# Patient Record
Sex: Male | Born: 1980 | Race: Black or African American | Hispanic: No | Marital: Single | Smoking: Never smoker
Health system: Southern US, Community
[De-identification: ages and names within clinical notes are randomized; demographics above are authoritative.]

## PROBLEM LIST (undated history)

## (undated) DIAGNOSIS — Z889 Allergy status to unspecified drugs, medicaments and biological substances status: Secondary | ICD-10-CM

## (undated) DIAGNOSIS — M109 Gout, unspecified: Secondary | ICD-10-CM

## (undated) DIAGNOSIS — I10 Essential (primary) hypertension: Secondary | ICD-10-CM

## (undated) DIAGNOSIS — M2550 Pain in unspecified joint: Secondary | ICD-10-CM

## (undated) DIAGNOSIS — L0591 Pilonidal cyst without abscess: Secondary | ICD-10-CM

## (undated) DIAGNOSIS — K219 Gastro-esophageal reflux disease without esophagitis: Secondary | ICD-10-CM

## (undated) DIAGNOSIS — E119 Type 2 diabetes mellitus without complications: Secondary | ICD-10-CM

## (undated) DIAGNOSIS — E785 Hyperlipidemia, unspecified: Secondary | ICD-10-CM

## (undated) DIAGNOSIS — R202 Paresthesia of skin: Secondary | ICD-10-CM

## (undated) HISTORY — DX: Pain in unspecified joint: M25.50

## (undated) HISTORY — DX: Pilonidal cyst without abscess: L05.91

## (undated) HISTORY — DX: Type 2 diabetes mellitus without complications: E11.9

## (undated) HISTORY — DX: Morbid (severe) obesity due to excess calories: E66.01

## (undated) HISTORY — DX: Hyperlipidemia, unspecified: E78.5

## (undated) HISTORY — DX: Paresthesia of skin: R20.2

## (undated) HISTORY — DX: Gout, unspecified: M10.9

---

## 2009-09-28 ENCOUNTER — Emergency Department (HOSPITAL_COMMUNITY): Admission: EM | Admit: 2009-09-28 | Discharge: 2009-09-28 | Payer: Self-pay | Admitting: Family Medicine

## 2009-11-07 ENCOUNTER — Ambulatory Visit (HOSPITAL_COMMUNITY): Admission: RE | Admit: 2009-11-07 | Discharge: 2009-11-07 | Payer: Self-pay | Admitting: Surgery

## 2010-09-10 HISTORY — PX: THROAT SURGERY: SHX803

## 2010-11-27 LAB — POCT I-STAT, CHEM 8
BUN: 14 mg/dL (ref 6–23)
Glucose, Bld: 98 mg/dL (ref 70–99)
Potassium: 3.8 mEq/L (ref 3.5–5.1)
TCO2: 30 mmol/L (ref 0–100)

## 2010-11-27 LAB — POCT RAPID STREP A (OFFICE): Streptococcus, Group A Screen (Direct): POSITIVE — AB

## 2010-11-30 LAB — BASIC METABOLIC PANEL
BUN: 9 mg/dL (ref 6–23)
Calcium: 9.5 mg/dL (ref 8.4–10.5)
Potassium: 4.4 mEq/L (ref 3.5–5.1)
Sodium: 141 mEq/L (ref 135–145)

## 2010-11-30 LAB — CBC
MCHC: 33.3 g/dL (ref 30.0–36.0)
MCV: 80.1 fL (ref 78.0–100.0)
RBC: 5.41 MIL/uL (ref 4.22–5.81)

## 2013-12-17 ENCOUNTER — Encounter (HOSPITAL_COMMUNITY): Payer: Self-pay | Admitting: Emergency Medicine

## 2013-12-17 ENCOUNTER — Emergency Department (HOSPITAL_COMMUNITY)
Admission: EM | Admit: 2013-12-17 | Discharge: 2013-12-18 | Disposition: A | Discharge status: Home / Self Care | Payer: BC Managed Care – PPO | Attending: Emergency Medicine | Admitting: Emergency Medicine

## 2013-12-17 ENCOUNTER — Emergency Department (HOSPITAL_COMMUNITY): Payer: BC Managed Care – PPO

## 2013-12-17 DIAGNOSIS — Z79899 Other long term (current) drug therapy: Secondary | ICD-10-CM | POA: Insufficient documentation

## 2013-12-17 DIAGNOSIS — F411 Generalized anxiety disorder: Secondary | ICD-10-CM | POA: Insufficient documentation

## 2013-12-17 DIAGNOSIS — E663 Overweight: Secondary | ICD-10-CM | POA: Insufficient documentation

## 2013-12-17 DIAGNOSIS — R0789 Other chest pain: Secondary | ICD-10-CM

## 2013-12-17 DIAGNOSIS — I10 Essential (primary) hypertension: Secondary | ICD-10-CM | POA: Insufficient documentation

## 2013-12-17 HISTORY — DX: Essential (primary) hypertension: I10

## 2013-12-17 LAB — I-STAT TROPONIN, ED: TROPONIN I, POC: 0.01 ng/mL (ref 0.00–0.08)

## 2013-12-17 LAB — CBC
HEMATOCRIT: 45.6 % (ref 39.0–52.0)
HEMOGLOBIN: 15.9 g/dL (ref 13.0–17.0)
MCH: 27.3 pg (ref 26.0–34.0)
MCHC: 34.9 g/dL (ref 30.0–36.0)
MCV: 78.4 fL (ref 78.0–100.0)
Platelets: 263 10*3/uL (ref 150–400)
RBC: 5.82 MIL/uL — AB (ref 4.22–5.81)
RDW: 13.6 % (ref 11.5–15.5)
WBC: 8.2 10*3/uL (ref 4.0–10.5)

## 2013-12-17 LAB — D-DIMER, QUANTITATIVE: D-Dimer, Quant: 0.36 ug/mL-FEU (ref 0.00–0.48)

## 2013-12-17 LAB — BASIC METABOLIC PANEL
BUN: 11 mg/dL (ref 6–23)
CALCIUM: 9.7 mg/dL (ref 8.4–10.5)
CHLORIDE: 103 meq/L (ref 96–112)
CO2: 27 meq/L (ref 19–32)
Creatinine, Ser: 1.25 mg/dL (ref 0.50–1.35)
GFR calc Af Amer: 87 mL/min — ABNORMAL LOW (ref >90)
GFR, EST NON AFRICAN AMERICAN: 75 mL/min — AB (ref >90)
Glucose, Bld: 102 mg/dL — ABNORMAL HIGH (ref 70–99)
POTASSIUM: 4.3 meq/L (ref 3.7–5.3)
Sodium: 143 mEq/L (ref 137–147)

## 2013-12-17 LAB — TROPONIN I: Troponin I: <0.3 ng/mL (ref <0.30)

## 2013-12-17 NOTE — Discharge Instructions (Signed)
Chest Pain (Nonspecific) °It is often hard to give a specific diagnosis for the cause of chest pain. There is always a chance that your pain could be related to something serious, such as a heart attack or a blood clot in the lungs. You need to follow up with your caregiver for further evaluation. °CAUSES  °· Heartburn. °· Pneumonia or bronchitis. °· Anxiety or stress. °· Inflammation around your heart (pericarditis) or lung (pleuritis or pleurisy). °· A blood clot in the lung. °· A collapsed lung (pneumothorax). It can develop suddenly on its own (spontaneous pneumothorax) or from injury (trauma) to the chest. °· Shingles infection (herpes zoster virus). °The chest wall is composed of bones, muscles, and cartilage. Any of these can be the source of the pain. °· The bones can be bruised by injury. °· The muscles or cartilage can be strained by coughing or overwork. °· The cartilage can be affected by inflammation and become sore (costochondritis). °DIAGNOSIS  °Lab tests or other studies, such as X-rays, electrocardiography, stress testing, or cardiac imaging, may be needed to find the cause of your pain.  °TREATMENT  °· Treatment depends on what may be causing your chest pain. Treatment may include: °· Acid blockers for heartburn. °· Anti-inflammatory medicine. °· Pain medicine for inflammatory conditions. °· Antibiotics if an infection is present. °· You may be advised to change lifestyle habits. This includes stopping smoking and avoiding alcohol, caffeine, and chocolate. °· You may be advised to keep your head raised (elevated) when sleeping. This reduces the chance of acid going backward from your stomach into your esophagus. °· Most of the time, nonspecific chest pain will improve within 2 to 3 days with rest and mild pain medicine. °HOME CARE INSTRUCTIONS  °· If antibiotics were prescribed, take your antibiotics as directed. Finish them even if you start to feel better. °· For the next few days, avoid physical  activities that bring on chest pain. Continue physical activities as directed. °· Do not smoke. °· Avoid drinking alcohol. °· Only take over-the-counter or prescription medicine for pain, discomfort, or fever as directed by your caregiver. °· Follow your caregiver's suggestions for further testing if your chest pain does not go away. °· Keep any follow-up appointments you made. If you do not go to an appointment, you could develop lasting (chronic) problems with pain. If there is any problem keeping an appointment, you must call to reschedule. °SEEK MEDICAL CARE IF:  °· You think you are having problems from the medicine you are taking. Read your medicine instructions carefully. °· Your chest pain does not go away, even after treatment. °· You develop a rash with blisters on your chest. °SEEK IMMEDIATE MEDICAL CARE IF:  °· You have increased chest pain or pain that spreads to your arm, neck, jaw, back, or abdomen. °· You develop shortness of breath, an increasing cough, or you are coughing up blood. °· You have severe back or abdominal pain, feel nauseous, or vomit. °· You develop severe weakness, fainting, or chills. °· You have a fever. °THIS IS AN EMERGENCY. Do not wait to see if the pain will go away. Get medical help at once. Call your local emergency services (911 in [place] Do not drive yourself to the hospital. °MAKE SURE YOU:  °· Understand these instructions. °· Will watch your condition. °· Will get help right away if you are not doing well or get worse. °Document Released: 06/06/2005 Document Revised: 11/19/2011 Document Reviewed: 04/01/2008 °ExitCare® Patient Information ©2014 [employer]   [employer]. ° °

## 2013-12-17 NOTE — ED Provider Notes (Signed)
CSN: [id number]     Arrival date & time 12/17/13  1802 History   First MD Initiated Contact with Patient 12/17/13 2059     Chief Complaint  Patient presents with  . Chest Pain     (Consider location/radiation/quality/duration/timing/severity/associated sxs/prior Treatment) Patient is a 33 y.o. male presenting with chest pain. The history is provided by the patient. No language interpreter was used.  Chest Pain Pain location:  L lateral chest Pain quality: burning   Pain radiates to:  Does not radiate Pain radiates to the back: no   Pain severity:  No pain Onset quality:  Gradual Duration:  7 days Timing:  Sporadic Progression:  Resolved Chronicity:  New Context: stress   Associated symptoms: anxiety   Associated symptoms: no nausea, no palpitations and no shortness of breath   Risk factors: hypertension, male sex and obesity   Patient endorses being anxious and having left sided chest discomfort intermittently for the last week after the death of a family friend.  No family history of early heart disease.  Patient is overweight with currently treated and normally well controlled hypertension.  Patient is pain-free at present.  Past Medical History  Diagnosis Date  . Hypertension    History reviewed. No pertinent past surgical history. History reviewed. No pertinent family history. History  Substance Use Topics  . Smoking status: Never Smoker   . Smokeless tobacco: Not on file  . Alcohol Use: No    Review of Systems  Respiratory: Negative for shortness of breath.   Cardiovascular: Positive for chest pain. Negative for palpitations.  Gastrointestinal: Negative for nausea.  All other systems reviewed and are negative.     Allergies  Review of patient's allergies indicates no known allergies.  Home Medications   Current Outpatient Rx  Name  Route  Sig  Dispense  Refill  . amLODipine (NORVASC) 5 MG tablet   Oral   Take 5 mg by mouth daily.         . Febuxostat  (ULORIC) 80 MG TABS   Oral   Take 1 tablet by mouth daily.         . metoprolol tartrate (LOPRESSOR) 25 MG tablet   Oral   Take 25 mg by mouth daily.         . Multiple Vitamin (MULTIVITAMIN WITH MINERALS) TABS tablet   Oral   Take 1 tablet by mouth daily.          BP 128/98  Pulse 58  Temp(Src) 98.1 F (36.7 C) (Oral)  Resp 18  Ht 6' (1.829 m)  Wt 464 lb 6.4 oz (210.65 kg)  BMI 62.97 kg/m2  SpO2 97% Physical Exam  Nursing note and vitals reviewed. Constitutional: He is oriented to person, place, and time. He appears well-developed and well-nourished. No distress.  HENT:  Head: Normocephalic.  Eyes: Conjunctivae are normal. Pupils are equal, round, and reactive to light.  Neck: Normal range of motion. No JVD present.  Cardiovascular: Normal rate and regular rhythm.   Pulmonary/Chest: Effort normal and breath sounds normal.  Abdominal: Soft. Bowel sounds are normal.  Musculoskeletal: Normal range of motion. He exhibits no edema and no tenderness.  Lymphadenopathy:    He has no cervical adenopathy.  Neurological: He is alert and oriented to person, place, and time.  Skin: Skin is warm and dry.  Psychiatric: He has a normal mood and affect. His behavior is normal. Judgment and thought content normal.    ED Course  Procedures (including critical  care time) Labs Review Labs Reviewed  CBC - Abnormal; Notable for the following:    RBC 5.82 (*)    All other components within normal limits  BASIC METABOLIC PANEL - Abnormal; Notable for the following:    Glucose, Bld 102 (*)    GFR calc non Af Amer 75 (*)    GFR calc Af Amer 87 (*)    All other components within normal limits  I-STAT TROPOININ, ED   Imaging Review Dg Chest 2 View  12/17/2013   CLINICAL DATA:  Left-sided chest pain  EXAM: CHEST  2 VIEW  COMPARISON:  DG CHEST 2 VIEW dated 11/03/2009  FINDINGS: The heart size and mediastinal contours are within normal limits. Both lungs are clear. The visualized  skeletal structures are unremarkable.  IMPRESSION: No active cardiopulmonary disease.   Electronically Signed   By: Elige Ko   On: 12/17/2013 18:55     EKG Interpretation None     Lab and radiology results reviewed and shared with patient.  No indication of ischemia on ECG, normal troponin. MDM   Final diagnoses:  None    Atypical chest pain.  Follow-up with PCP. Return precautions discussed.    Jimmye Norman, NP 12/18/13 [phone]

## 2013-12-17 NOTE — ED Notes (Signed)
Pt c/o L sided "surface area chest irritation" for past week. He states he thinks part of it is due to recent death in family that he has been worried about but he just wants to be checked out. He is A&Ox4, resp e/u

## 2013-12-18 NOTE — ED Notes (Signed)
Pt A&OX4, ambulatory at discharge, verbalizing no complaints at this time. 

## 2013-12-18 NOTE — ED Provider Notes (Signed)
Medical screening examination/treatment/procedure(s) were conducted as a shared visit with non-physician practitioner(s) and myself.  I personally evaluated the patient during the encounter.  Morbidly obese male with 1 week history of L sided "soreness" in chest that comes and goes, lasting several hours at a time, goes away when "he doesn't think about it."  No cardiac history. No SOB, nausea, diaphoresis.  Stress with recent death in family. Atypical for ACS or PE. EKG nsr, no changes. Troponin negative x 2, d-dimer negative.   EKG Interpretation   Date/Time:  Thursday December 17 2013 18:07:42 EDT Ventricular Rate:  86 PR Interval:  188 QRS Duration: 98 QT Interval:  364 QTC Calculation: 435 R Axis:   9 Text Interpretation:  Normal sinus rhythm Normal ECG No significant change  was found Confirmed by Manus GunningANCOUR  MD, Kynsli Haapala [phone]([id number]) on 12/17/2013 9:32:39 PM       Glynn OctaveStephen Akshaj Besancon, MD 12/18/13 1029

## 2014-06-25 ENCOUNTER — Ambulatory Visit
Admission: RE | Admit: 2014-06-25 | Discharge: 2014-06-25 | Disposition: A | Discharge status: Home / Self Care | Payer: BC Managed Care – PPO | Source: Ambulatory Visit | Attending: Family Medicine | Admitting: Family Medicine

## 2014-06-25 ENCOUNTER — Other Ambulatory Visit: Payer: Self-pay | Admitting: Family Medicine

## 2014-06-25 DIAGNOSIS — M25532 Pain in left wrist: Secondary | ICD-10-CM

## 2014-08-09 ENCOUNTER — Encounter: Payer: Self-pay | Admitting: *Deleted

## 2014-08-10 ENCOUNTER — Encounter: Payer: BC Managed Care – PPO | Admitting: Neurology

## 2014-08-10 ENCOUNTER — Other Ambulatory Visit: Payer: Self-pay | Admitting: *Deleted

## 2014-08-10 DIAGNOSIS — R202 Paresthesia of skin: Secondary | ICD-10-CM

## 2014-08-12 ENCOUNTER — Encounter: Payer: Self-pay | Admitting: *Deleted

## 2014-08-12 ENCOUNTER — Telehealth: Payer: Self-pay | Admitting: Neurology

## 2014-08-12 NOTE — Telephone Encounter (Signed)
Pt no showed 08/10/14 EMG appt w/ Dr. Allena KatzPatel.  Alcario DroughtErica - please send a no show letter to patient and contact Dr. Merita NortonSwayne's office / Oneita KrasSherri S.

## 2014-08-12 NOTE — Progress Notes (Unsigned)
No show letter sent for 08/10/2014 

## 2014-12-01 NOTE — Progress Notes (Signed)
Please put orders in Epic surgery 12-10-14 pre op 12-06-14 Thanks

## 2014-12-02 ENCOUNTER — Other Ambulatory Visit (HOSPITAL_COMMUNITY): Payer: Self-pay | Admitting: *Deleted

## 2014-12-02 NOTE — Patient Instructions (Addendum)
Kara PacerChristopher Bernath  12/02/2014   Your procedure is scheduled on: Friday April 1st, 2016  [facility]  Entr[facility] and follow signs to               Short Stay Center at 745 AM.  Call this number if you have problems the morning of surgery [phone]   Remember:  Do not eat food or drink liquids :After Midnight. Thursday NIGHT     Take these medicines the morning of surgery with A SIP OF WATER: NONE                              You may not have any metal on your body including hair pins and              piercings  Do not wear jewelry, make-up, lotions, powders or perfumes.             Do not wear nail polish.  Do not shave  48 hours prior to surgery.              Men may shave face and neck.   Do not bring valuables to the hospital. [facility] IS NOT             RESPONSIBLE   FOR VALUABLES.  Contacts, dentures or bridgework may not be worn into surgery.  Leave suitcase in the car. After surgery it may be brought to your room.     Patients discharged the day of surgery will not be allowed to drive home.  Name and phone number of your driver:WIFE  Special Instructions: N/A              Please read over the following fact sheets you were given: _____________________________________________________________________             [facility] - Preparing for [facility] Before surgery, you can play an important role.  Because skin is not sterile, your skin needs to be as free of germs as possible.  You can reduce the number of germs on your skin by washing with CHG (chlorahexidine gluconate) soap before surgery.  CHG is an antiseptic cleaner which kills germs and bonds with the skin to continue killing germs even after washing. Please DO NOT use if you have an allergy to CHG or antibacterial soaps.  If your skin becomes reddened/irritated stop using the CHG and inform your nurse when you arrive at Short Stay. Do not shave (including legs and underarms) for at  least 48 hours prior to the first CHG shower.  You may shave your face/neck. Please follow these instructions carefully:  1.  Shower with CHG Soap the night before surgery and the  morning of Surgery.  2.  If you choose to wash your hair, wash your hair first as usual with your  normal  shampoo.  3.  After you shampoo, rinse your hair and body thoroughly to remove the  shampoo.                           4.  Use CHG as you would any other liquid soap.  You can apply chg directly  to the skin and wash  Gently with a scrungie or clean washcloth.  5.  Apply the CHG Soap to your body ONLY FROM THE NECK DOWN.   Do not use on face/ open                           Wound or open sores. Avoid contact with eyes, ears mouth and genitals (private parts).                       Wash face,  Genitals (private parts) with your normal soap.             6.  Wash thoroughly, paying special attention to the area where your surgery  will be performed.  7.  Thoroughly rinse your body with warm water from the neck down.  8.  DO NOT shower/wash with your normal soap after using and rinsing off  the CHG Soap.                9.  Pat yourself dry with a clean towel.            10.  Wear clean pajamas.            11.  Place clean sheets on your bed the night of your first shower and do not  sleep with pets. Day of Surgery : Do not apply any lotions/deodorants the morning of surgery.  Please wear clean clothes to the hospital/surgery center.  FAILURE TO FOLLOW THESE INSTRUCTIONS MAY RESULT IN THE CANCELLATION OF YOUR SURGERY PATIENT SIGNATURE_________________________________  NURSE SIGNATURE__________________________________  ________________________________________________________________________

## 2014-12-02 NOTE — Progress Notes (Signed)
ekg 12-17-13 epic Chest xray 12-17-13 epic

## 2014-12-06 ENCOUNTER — Encounter (HOSPITAL_COMMUNITY)
Admission: RE | Admit: 2014-12-06 | Discharge: 2014-12-06 | Disposition: A | Discharge status: Home / Self Care | Payer: BLUE CROSS/BLUE SHIELD | Source: Ambulatory Visit | Attending: Surgery | Admitting: Surgery

## 2014-12-06 ENCOUNTER — Encounter (HOSPITAL_COMMUNITY): Payer: Self-pay

## 2014-12-06 DIAGNOSIS — L0591 Pilonidal cyst without abscess: Secondary | ICD-10-CM | POA: Insufficient documentation

## 2014-12-06 DIAGNOSIS — Z01812 Encounter for preprocedural laboratory examination: Secondary | ICD-10-CM | POA: Diagnosis not present

## 2014-12-06 HISTORY — DX: Allergy status to unspecified drugs, medicaments and biological substances: Z88.9

## 2014-12-06 HISTORY — DX: Gastro-esophageal reflux disease without esophagitis: K21.9

## 2014-12-06 LAB — BASIC METABOLIC PANEL WITH GFR
Anion gap: 9 (ref 5–15)
BUN: 19 mg/dL (ref 6–23)
CO2: 29 mmol/L (ref 19–32)
Calcium: 9.1 mg/dL (ref 8.4–10.5)
Chloride: 104 mmol/L (ref 96–112)
Creatinine, Ser: 1.35 mg/dL (ref 0.50–1.35)
GFR calc Af Amer: 79 mL/min — ABNORMAL LOW
GFR calc non Af Amer: 68 mL/min — ABNORMAL LOW
Glucose, Bld: 98 mg/dL (ref 70–99)
Potassium: 4.3 mmol/L (ref 3.5–5.1)
Sodium: 142 mmol/L (ref 135–145)

## 2014-12-06 LAB — CBC
HEMATOCRIT: 45.3 % (ref 39.0–52.0)
Hemoglobin: 14.4 g/dL (ref 13.0–17.0)
MCH: 25.5 pg — AB (ref 26.0–34.0)
MCHC: 31.8 g/dL (ref 30.0–36.0)
MCV: 80.3 fL (ref 78.0–100.0)
Platelets: 289 10*3/uL (ref 150–400)
RBC: 5.64 MIL/uL (ref 4.22–5.81)
RDW: 13.7 % (ref 11.5–15.5)
WBC: 10 10*3/uL (ref 4.0–10.5)

## 2014-12-06 NOTE — Progress Notes (Signed)
Dr Marvis RepressMartin--Need pre op orders please-  Surgery 12/10/14

## 2014-12-06 NOTE — Progress Notes (Signed)
   12/06/14 1056  OBSTRUCTIVE SLEEP APNEA  Have you ever been diagnosed with sleep apnea through a sleep study? No  Do you snore loudly (loud enough to be heard through closed doors)?  1  Do you often feel tired, fatigued, or sleepy during the daytime? 0  Has anyone observed you stop breathing during your sleep? 0  Do you have, or are you being treated for high blood pressure? 1  BMI more than 35 kg/m2? 1  Age over 33 years old? 0  Neck circumference greater than 40 cm/16 inches? 1  Gender: 1

## 2014-12-07 ENCOUNTER — Ambulatory Visit: Payer: Self-pay | Admitting: Surgery

## 2014-12-10 ENCOUNTER — Ambulatory Visit (HOSPITAL_COMMUNITY): Payer: BLUE CROSS/BLUE SHIELD | Admitting: Certified Registered Nurse Anesthetist

## 2014-12-10 ENCOUNTER — Encounter (HOSPITAL_COMMUNITY)
Admission: RE | Disposition: A | Discharge status: Home / Self Care | Payer: Self-pay | Source: Ambulatory Visit | Attending: Surgery

## 2014-12-10 ENCOUNTER — Ambulatory Visit (HOSPITAL_COMMUNITY)
Admission: RE | Admit: 2014-12-10 | Discharge: 2014-12-10 | Disposition: A | Discharge status: Home / Self Care | Payer: BLUE CROSS/BLUE SHIELD | Source: Ambulatory Visit | Attending: Surgery | Admitting: Surgery

## 2014-12-10 ENCOUNTER — Encounter (HOSPITAL_COMMUNITY): Payer: Self-pay | Admitting: *Deleted

## 2014-12-10 DIAGNOSIS — E785 Hyperlipidemia, unspecified: Secondary | ICD-10-CM | POA: Diagnosis not present

## 2014-12-10 DIAGNOSIS — K219 Gastro-esophageal reflux disease without esophagitis: Secondary | ICD-10-CM | POA: Diagnosis not present

## 2014-12-10 DIAGNOSIS — M109 Gout, unspecified: Secondary | ICD-10-CM | POA: Diagnosis not present

## 2014-12-10 DIAGNOSIS — Z6841 Body Mass Index (BMI) 40.0 and over, adult: Secondary | ICD-10-CM | POA: Insufficient documentation

## 2014-12-10 DIAGNOSIS — I10 Essential (primary) hypertension: Secondary | ICD-10-CM | POA: Insufficient documentation

## 2014-12-10 DIAGNOSIS — L0501 Pilonidal cyst with abscess: Secondary | ICD-10-CM | POA: Insufficient documentation

## 2014-12-10 HISTORY — PX: PILONIDAL CYST EXCISION: SHX744

## 2014-12-10 LAB — GLUCOSE, CAPILLARY: GLUCOSE-CAPILLARY: 119 mg/dL — AB (ref 70–99)

## 2014-12-10 SURGERY — EXCISION, PILONIDAL CYST, EXTENSIVE
Anesthesia: General | Site: Coccyx

## 2014-12-10 MED ORDER — PROPOFOL 10 MG/ML IV BOLUS
INTRAVENOUS | Status: AC
  Filled 2014-12-10: qty 20

## 2014-12-10 MED ORDER — PROMETHAZINE HCL 25 MG/ML IJ SOLN
6.2500 mg | INTRAMUSCULAR | Status: DC | PRN
Start: 2014-12-10 — End: 2014-12-10

## 2014-12-10 MED ORDER — LIDOCAINE HCL (CARDIAC) 20 MG/ML IV SOLN
INTRAVENOUS | Status: DC | PRN
  Administered 2014-12-10: 100 mg via INTRAVENOUS

## 2014-12-10 MED ORDER — ONDANSETRON HCL 4 MG/2ML IJ SOLN
INTRAMUSCULAR | Status: DC | PRN
  Administered 2014-12-10: 4 mg via INTRAVENOUS

## 2014-12-10 MED ORDER — MIDAZOLAM HCL 2 MG/2ML IJ SOLN
INTRAMUSCULAR | Status: DC | PRN
  Administered 2014-12-10: 2 mg via INTRAVENOUS

## 2014-12-10 MED ORDER — FENTANYL CITRATE 0.05 MG/ML IJ SOLN
INTRAMUSCULAR | Status: DC | PRN
  Administered 2014-12-10: 50 ug via INTRAVENOUS
  Administered 2014-12-10: 100 ug via INTRAVENOUS

## 2014-12-10 MED ORDER — DEXTROSE 5 % IV SOLN
INTRAVENOUS | Status: AC
  Filled 2014-12-10: qty 2

## 2014-12-10 MED ORDER — SODIUM CHLORIDE 0.9 % IJ SOLN: 3.0000 mL | Freq: Two times a day (BID) | INTRAMUSCULAR | Status: DC

## 2014-12-10 MED ORDER — LACTATED RINGERS IV SOLN
INTRAVENOUS | Status: DC
  Administered 2014-12-10 (×2): via INTRAVENOUS
  Administered 2014-12-10: 1000 mL via INTRAVENOUS

## 2014-12-10 MED ORDER — BUPIVACAINE LIPOSOME 1.3 % IJ SUSP
20.0000 mL | Freq: Once | INTRAMUSCULAR | Status: AC
  Administered 2014-12-10: 20 mL
  Filled 2014-12-10: qty 20

## 2014-12-10 MED ORDER — LACTATED RINGERS IV SOLN: INTRAVENOUS | Status: DC

## 2014-12-10 MED ORDER — DEXTROSE 5 % IV SOLN
2.0000 g | INTRAVENOUS | Status: AC
  Administered 2014-12-10: 2 g via INTRAVENOUS

## 2014-12-10 MED ORDER — FENTANYL CITRATE 0.05 MG/ML IJ SOLN
25.0000 ug | INTRAMUSCULAR | Status: DC | PRN
Start: 2014-12-10 — End: 2014-12-10
  Administered 2014-12-10: 25 ug via INTRAVENOUS

## 2014-12-10 MED ORDER — CHLORHEXIDINE GLUCONATE 4 % EX LIQD: 1.0000 "application " | Freq: Once | CUTANEOUS | Status: DC

## 2014-12-10 MED ORDER — HEPARIN SODIUM (PORCINE) 5000 UNIT/ML IJ SOLN
5000.0000 [IU] | Freq: Once | INTRAMUSCULAR | Status: AC
  Administered 2014-12-10: 5000 [IU] via SUBCUTANEOUS
  Filled 2014-12-10: qty 1

## 2014-12-10 MED ORDER — SUCCINYLCHOLINE CHLORIDE 20 MG/ML IJ SOLN
INTRAMUSCULAR | Status: DC | PRN
  Administered 2014-12-10: 200 mg via INTRAVENOUS

## 2014-12-10 MED ORDER — FENTANYL CITRATE 0.05 MG/ML IJ SOLN
INTRAMUSCULAR | Status: AC
  Filled 2014-12-10: qty 5

## 2014-12-10 MED ORDER — MEPERIDINE HCL 50 MG/ML IJ SOLN: 6.2500 mg | INTRAMUSCULAR | Status: DC | PRN

## 2014-12-10 MED ORDER — ROCURONIUM BROMIDE 100 MG/10ML IV SOLN
INTRAVENOUS | Status: DC | PRN
  Administered 2014-12-10: 30 mg via INTRAVENOUS

## 2014-12-10 MED ORDER — SODIUM CHLORIDE 0.9 % IV SOLN: 250.0000 mL | INTRAVENOUS | Status: DC | PRN

## 2014-12-10 MED ORDER — ACETAMINOPHEN 650 MG RE SUPP
650.0000 mg | RECTAL | Status: DC | PRN
  Filled 2014-12-10: qty 1

## 2014-12-10 MED ORDER — OXYCODONE-ACETAMINOPHEN 7.5-325 MG PO TABS: 1.0000 | ORAL_TABLET | ORAL | Status: DC | PRN

## 2014-12-10 MED ORDER — PHENYLEPHRINE HCL 10 MG/ML IJ SOLN
INTRAMUSCULAR | Status: DC | PRN
  Administered 2014-12-10 (×3): 120 ug via INTRAVENOUS
  Administered 2014-12-10: 160 ug via INTRAVENOUS

## 2014-12-10 MED ORDER — ACETAMINOPHEN 325 MG PO TABS: 650.0000 mg | ORAL_TABLET | ORAL | Status: DC | PRN

## 2014-12-10 MED ORDER — PROPOFOL 10 MG/ML IV BOLUS
INTRAVENOUS | Status: DC | PRN
  Administered 2014-12-10: 100 mg via INTRAVENOUS
  Administered 2014-12-10: 300 mg via INTRAVENOUS

## 2014-12-10 MED ORDER — OXYCODONE HCL 5 MG PO TABS: 5.0000 mg | ORAL_TABLET | ORAL | Status: DC | PRN

## 2014-12-10 MED ORDER — GLYCOPYRROLATE 0.2 MG/ML IJ SOLN
INTRAMUSCULAR | Status: DC | PRN
  Administered 2014-12-10: .8 mg via INTRAVENOUS

## 2014-12-10 MED ORDER — FENTANYL CITRATE 0.05 MG/ML IJ SOLN
INTRAMUSCULAR | Status: AC
  Filled 2014-12-10: qty 2

## 2014-12-10 MED ORDER — MIDAZOLAM HCL 2 MG/2ML IJ SOLN
INTRAMUSCULAR | Status: AC
  Filled 2014-12-10: qty 2

## 2014-12-10 MED ORDER — SODIUM CHLORIDE 0.9 % IJ SOLN: 3.0000 mL | INTRAMUSCULAR | Status: DC | PRN

## 2014-12-10 MED ORDER — NEOSTIGMINE METHYLSULFATE 10 MG/10ML IV SOLN
INTRAVENOUS | Status: DC | PRN
  Administered 2014-12-10: 4 mg via INTRAVENOUS

## 2014-12-10 SURGICAL SUPPLY — 15 items
DRAPE LAPAROTOMY T 102X78X121 (DRAPES) ×2 IMPLANT
DRAPE POUCH INSTRU U-SHP 10X18 (DRAPES) ×2 IMPLANT
GAUZE PACKING IODOFORM 2 (PACKING) ×2 IMPLANT
GAUZE SPONGE 4X4 16PLY XRAY LF (GAUZE/BANDAGES/DRESSINGS) ×2 IMPLANT
GLOVE BIOGEL PI IND STRL 8 (GLOVE) ×2 IMPLANT
GLOVE BIOGEL PI INDICATOR 8 (GLOVE) ×2
GLOVE ECLIPSE 8.0 STRL XLNG CF (GLOVE) IMPLANT
GOWN STRL REUS W/TWL LRG LVL3 (GOWN DISPOSABLE) ×4 IMPLANT
GOWN STRL REUS W/TWL XL LVL3 (GOWN DISPOSABLE) ×8 IMPLANT
KIT BASIN OR (CUSTOM PROCEDURE TRAY) ×2 IMPLANT
LUBRICANT JELLY K Y 4OZ (MISCELLANEOUS) ×2 IMPLANT
PACK GENERAL/GYN (CUSTOM PROCEDURE TRAY) ×2 IMPLANT
TAPE CLOTH SURG 4X10 WHT LF (GAUZE/BANDAGES/DRESSINGS) ×2 IMPLANT
TUBE ANAEROBIC SPECIMEN COL (MISCELLANEOUS) ×2 IMPLANT
WATER STERILE IRR 500ML POUR (IV SOLUTION) ×2 IMPLANT

## 2014-12-10 NOTE — Progress Notes (Signed)
Orders written for home health RN for dressing changes to begin Sunday December 12, 2014.  Explained home health process with patient and wife.  Wife is willing to learn dressing change procedure.  Choice offered. Called referral into [facility] per the patient's choice.

## 2014-12-10 NOTE — H&P (Signed)
Chief Complaint:  Recurrent pilonidal abscess  History of Present Illness:  Howard Thomas is an 34 y.o. male who has had recurrent pain and infection in a pilonidal abscess.  He was seen for this in the office and informed consent obtained regarding excision and closure or open packing.  He had more drainage last week and this may make open packing more of a likelihood.    Past Medical History  Diagnosis Date  . Hypertension   . Gout   . Morbid obesity   . Pilonidal cyst   . Paresthesia   . Dyslipidemia   . Joint pain   . H/O seasonal allergies   . GERD (gastroesophageal reflux disease)   . Diabetes     12/06/14  pt denies    Past Surgical History  Procedure Laterality Date  . 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 cefOXitin (MEFOXIN) 2 g in dextrose 5 % 50 mL IVPB  2 g Intravenous On Call to OR Luretha Murphy, MD      . chlorhexidine (HIBICLENS) 4 % liquid 1 application  1 application Topical Once Luretha Murphy, MD      . lactated ringers infusion   Intravenous Continuous Phillips Grout, MD 125 mL/hr at 12/10/14 0921 1,000 mL at 12/10/14 1610   Review of patient's allergies indicates no known allergies. Family History  Problem Relation Age of Onset  . Hyperlipidemia Maternal Grandmother    Social History:   reports that he has never smoked. He has never used smokeless tobacco. He reports that he does not drink alcohol or use illicit drugs.   REVIEW OF SYSTEMS : Negative except for see HPI  Physical Exam:   Blood pressure 163/94, pulse 62, temperature 98.9 F (37.2 C), temperature source Oral, resp. rate 18, height 6' (1.829 m), weight 209.562 kg (462 lb), SpO2 96 %. Body mass index is 62.64 kg/(m^2).  Gen:  WDWN AAM NAD-obese  Neurological: Alert and oriented to person, place, and time. Motor and sensory function is grossly intact  Head:  Normocephalic and atraumatic.  Eyes: Conjunctivae are normal. Pupils are equal, round, and reactive to light. No scleral icterus.  Neck: Normal range of motion. Neck supple. No tracheal deviation or thyromegaly present.  Cardiovascular:  SR without murmurs or gallops.  No carotid bruits Breast:  Not examined Respiratory: Effort normal.  No respiratory distress. No chest wall tenderness. Breath sounds normal.  No wheezes, rales or rhonchi.  Abdomen:  Non tender GU:  Not examined.  Buttock with recurrent pilonidal in the buttock cleft.   Musculoskeletal: Normal range of motion. Extremities are nontender. No cyanosis, edema or clubbing noted Lymphadenopathy: No cervical, preauricular, postauricular or axillary adenopathy is present Skin: Skin is warm and dry. No rash noted. No diaphoresis. No erythema. No pallor. Pscyh: Normal mood and affect. Behavior is normal. Judgment and thought content normal.   LABORATORY RESULTS: Results for orders placed or performed during the hospital encounter of 12/10/14 (from the past 48 hour(s))  Glucose, capillary     Status: Abnormal   Collection Time: 12/10/14  8:20 AM  Result Value Ref Range   Glucose-Capillary 119 (H) 70 - 99 mg/dL     RADIOLOGY RESULTS: No results found.  Problem List: There are no active problems to display for this patient.   Assessment & Plan: Pilonidal abscess/cyst--for operative excision    Matt B.  Daphine DeutscherMartin, MD, [facility]  [facility] [phone] beeper [phone]  12/10/2014 9:34 AM

## 2014-12-10 NOTE — Transfer of Care (Signed)
Immediate Anesthesia Transfer of Care Note  Patient: Howard PacerChristopher Scobie  Procedure(s) Performed: Procedure(s): CYST EXCISION PILONIDAL EXTENSIVE (N/A)  Patient Location: PACU  Anesthesia Type:General  Level of Consciousness: awake, alert  and oriented  Airway & Oxygen Therapy: Patient Spontanous Breathing and Patient connected to face mask oxygen  Post-op Assessment: Report given to RN and Post -op Vital signs reviewed and stable  Post vital signs: Reviewed and stable  Last Vitals:  Filed Vitals:   12/10/14 0822  BP: 163/94  Pulse: 62  Temp: 37.2 C  Resp: 18    Complications: No apparent anesthesia complications

## 2014-12-10 NOTE — Care Management Note (Signed)
    Page 1 of 1   12/10/2014     12:57:58 PM CARE MANAGEMENT NOTE 12/10/2014  Patient:  Howard Thomas,Howard Thomas   Account Number:  [id number]  Date Initiated:  12/10/2014  Documentation initiated by:  Doris Cheadle  Subjective/Objective Assessment:   s/p pilondial cyst removal today and needs home health dressing changes arranged to begin on Sunday April 3     Action/Plan:   Met with patient and wife and explained scope of [place] RN. Patient's wife is willing to learn dressing change.   Anticipated DC Date:  12/10/2014   Anticipated DC Plan:  [place]  In-house referral  NA         [facility] Choice  HOME HEALTH   Choice offered to / List presented to:  C-1 Patient        [place] arranged  HH-1 RN      [place].   Status of service:  Completed, signed off Medicare Important Message given?  NA - LOS <3 / Initial given by admissions (If response is "NO", the following Medicare IM given date fields will be blank) Date Medicare IM given:   Medicare IM given by:   Date Additional Medicare IM given:   Additional Medicare IM given by:    Discharge Disposition:  [place]  Per UR Regulation:    If discussed at Long Length of Stay Meetings, dates discussed:    Comments:

## 2014-12-10 NOTE — Anesthesia Preprocedure Evaluation (Addendum)
Anesthesia Evaluation  Patient identified by MRN, date of birth, ID band Patient awake    Reviewed: Allergy & Precautions, NPO status , Patient's Chart, lab work & pertinent test results  Airway Mallampati: II  TM Distance: >3 FB Neck ROM: Full    Dental no notable dental hx.    Pulmonary neg pulmonary ROS,  breath sounds clear to auscultation  Pulmonary exam normal       Cardiovascular hypertension, Pt. on medications Rhythm:Regular Rate:Normal     Neuro/Psych negative neurological ROS  negative psych ROS   GI/Hepatic negative GI ROS, Neg liver ROS,   Endo/Other  diabetesMorbid obesity  Renal/GU negative Renal ROS  negative genitourinary   Musculoskeletal negative musculoskeletal ROS (+)   Abdominal   Peds negative pediatric ROS (+)  Hematology negative hematology ROS (+)   Anesthesia Other Findings   Reproductive/Obstetrics negative OB ROS                            Anesthesia Physical Anesthesia Plan  ASA: III  Anesthesia Plan: General   Post-op Pain Management:    Induction: Intravenous  Airway Management Planned: Oral ETT  Additional Equipment:   Intra-op Plan:   Post-operative Plan: Extubation in OR  Informed Consent: I have reviewed the patients History and Physical, chart, labs and discussed the procedure including the risks, benefits and alternatives for the proposed anesthesia with the patient or authorized representative who has indicated his/her understanding and acceptance.   Dental advisory given  Plan Discussed with: CRNA  Anesthesia Plan Comments:         Anesthesia Quick Evaluation  

## 2014-12-10 NOTE — Discharge Instructions (Signed)
Take Percocet as needed for pain.  Avoid constipation with laxative of choice. Every other day wound packing with iodophor gauze.

## 2014-12-10 NOTE — Progress Notes (Signed)
Howard PolioMary Beth Thomas Care Management in to see patient and wife to set home home health care.

## 2014-12-10 NOTE — Anesthesia Procedure Notes (Signed)
Procedure Name: Intubation Date/Time: 12/10/2014 10:04 AM Performed by: [place], Markeita Alicia C Pre-anesthesia Checklist: Patient identified, Emergency Drugs available, Suction available and Patient being monitored Patient Re-evaluated:Patient Re-evaluated prior to inductionOxygen Delivery Method: Circle system utilized Preoxygenation: Pre-oxygenation with 100% oxygen Intubation Type: IV induction Ventilation: Two handed mask ventilation required and Oral airway inserted - appropriate to patient size Laryngoscope Size: Mac and 4 Grade View: Grade II Tube type: Oral Tube size: 7.5 mm Number of attempts: 1 Placement Confirmation: ETT inserted through vocal cords under direct vision,  positive ETCO2,  CO2 detector and breath sounds checked- equal and bilateral Secured at: 22 cm Tube secured with: Tape Dental Injury: Teeth and Oropharynx as per pre-operative assessment

## 2014-12-10 NOTE — Op Note (Addendum)
Surgeon: Wenda LowMatt Terrie Grajales, MD, FACS  Asst:  none  Anes:  General in prone position  Procedure: Excision of infected recurrent complex pilonidal abscess  Diagnosis: Recurrent pilonidal abscess  Complications: none  EBL:   minimal cc  Drains: None-1 inch Iodophor packing used  Description of Procedure:  [facility]  After anesthesia was administered and the patient was prepped a timeout was performed.  The patient was in the prone position with the buttocks taped apart.  The drainage site was mainly in the left side.  I made an eccentric linear incision taking more of the inflamed skin and excised what proved to be a pus containing abscess.  Thick granulation surrounded the abscess and this was excised with the abscess.  Bleeding was controlled with 3-0 vicryl; The pus was cultured and the wound was left open after packing with Iodophor gauze.  The cavity was infiltrated with 20 cc of Exparel  The patient tolerated the procedure well and was taken to the PACU in stable condition.  Will make arrangements for home health to change the packing on an every day basis beginning on Sunday if possible.    Matt B. Daphine DeutscherMartin, MD, [facility], [place] [phone]

## 2014-12-10 NOTE — Anesthesia Postprocedure Evaluation (Signed)
  Anesthesia Post-op Note  Patient: Howard Thomas  Procedure(s) Performed: Procedure(s) (LRB): CYST EXCISION PILONIDAL EXTENSIVE (N/A)  Patient Location: PACU  Anesthesia Type: General  Level of Consciousness: awake and alert   Airway and Oxygen Therapy: Patient Spontanous Breathing  Post-op Pain: mild  Post-op Assessment: Post-op Vital signs reviewed, Patient's Cardiovascular Status Stable, Respiratory Function Stable, Patent Airway and No signs of Nausea or vomiting  Last Vitals:  Filed Vitals:   12/10/14 1247  BP: 143/77  Pulse: 68  Temp:   Resp: 20    Post-op Vital Signs: stable   Complications: No apparent anesthesia complications

## 2014-12-12 LAB — WOUND CULTURE: CULTURE: NO GROWTH

## 2014-12-13 ENCOUNTER — Encounter (HOSPITAL_COMMUNITY): Payer: Self-pay | Admitting: Surgery

## 2014-12-15 LAB — ANAEROBIC CULTURE

## 2016-03-05 ENCOUNTER — Other Ambulatory Visit: Payer: Self-pay | Admitting: Chiropractic Medicine

## 2016-03-05 ENCOUNTER — Ambulatory Visit
Admission: RE | Admit: 2016-03-05 | Discharge: 2016-03-05 | Disposition: A | Discharge status: Home / Self Care | Payer: BLUE CROSS/BLUE SHIELD | Source: Ambulatory Visit | Attending: Chiropractic Medicine | Admitting: Chiropractic Medicine

## 2016-03-05 DIAGNOSIS — M545 Low back pain: Secondary | ICD-10-CM

## 2016-11-04 NOTE — Progress Notes (Signed)
Cardiology Office Note  NEW PATIENT VISIT   Date:  11/05/2016   ID:  Howard Thomas, DOB [birth year], MRN [id number]  PCP:  Alysia Penna, MD  Cardiologist:  New Dr. Jens Som     Chief Complaint  Patient presents with  . New Patient (Initial Visit)    patient had episode of chest pain with working out. patient was given PPI from pcp. seems to have helped symptoms.      History of Present Illness: Howard Thomas is a 36 y.o. male who presents for chest pressure and lt arm burning and across chest.  Referred by [name], NP.   He had just started working out and he developed pressure under Lt breast first then later pain from both shoulder and across chest.  He thught was due to pain from exercise.    [name] has a hx of HTN, obesity, HLD and gout.    Today he feels much better.  No chest pain or SOB.  His pain occurred after exercising.  On PCP visit his BP was 160/105 his bp med adjusted.  Also protonix added.   He does snore.  We discussed his wt, he was working out of his home with most of wt gain.  Discussed sleep apnea and need to lose wt either through exercise and diet - on own or through a hospital plan or gastric bypass.      Past Medical History:  Diagnosis Date  . Diabetes (HCC)    12/06/14  pt denies  . Dyslipidemia   . GERD (gastroesophageal reflux disease)   . Gout   . H/O seasonal allergies   . Hypertension   . Joint pain   . Morbid obesity (HCC)   . Paresthesia   . Pilonidal cyst     Past Surgical History:  Procedure Laterality Date  . PILONIDAL CYST EXCISION N/A 12/10/2014   Procedure: CYST EXCISION PILONIDAL EXTENSIVE;  Surgeon: Luretha Murphy, MD;  Location: [facility];  Service: General;  Laterality: N/A;  . THROAT SURGERY  2012   exc vocal cord lesion     Current Outpatient Prescriptions  Medication Sig Dispense Refill  . amLODipine-benazepril (LOTREL) 10-40 MG per capsule Take 1 capsule by mouth at bedtime.     . Febuxostat (ULORIC) 80  MG TABS Take 1 tablet by mouth at bedtime.     . hydrochlorothiazide (HYDRODIURIL) 25 MG tablet Take 25 mg by mouth daily.    . metoprolol succinate (TOPROL-XL) 100 MG 24 hr tablet Take 100 mg by mouth at bedtime. Take with or immediately following a meal.    . Multiple Vitamin (MULTIVITAMIN WITH MINERALS) TABS tablet Take 1 tablet by mouth daily.    . pantoprazole (PROTONIX) 40 MG tablet Take 40 mg by mouth daily.     No current facility-administered medications for this visit.     Allergies:   Patient has no known allergies.    Social History:  The patient  reports that he has never smoked. He has never used smokeless tobacco. He reports that he does not drink alcohol or use drugs.   Family History:  The patient's family history includes CVA in his father; Hyperlipidemia in his maternal grandmother; Hypertension in his mother.    ROS:  General:no colds or fevers, + weight gain Skin:no rashes or ulcers HEENT:no blurred vision, no congestion CV:see HPI PUL:see HPI GI:no diarrhea constipation or melena, no indigestion GU:no hematuria, no dysuria MS:no joint pain, no claudication Neuro:no syncope, no lightheadedness Endo:no  diabetes, no thyroid disease  Wt Readings from Last 3 Encounters:  11/05/16 (!) 489 lb 9.6 oz (222.1 kg)  12/10/14 (!) 462 lb (209.6 kg)  12/06/14 (!) 462 lb (209.6 kg)     PHYSICAL EXAM: VS:  BP 120/80   Pulse 75   Ht 6' (1.829 m)   Wt (!) 489 lb 9.6 oz (222.1 kg)   BMI 66.40 kg/m  , BMI Body mass index is 66.4 kg/m. General:Pleasant affect, NAD Skin:Warm and dry, brisk capillary refill HEENT:normocephalic, sclera clear, mucus membranes moist Neck:supple, no JVD, no bruits  Heart:S1S2 RRR without murmur, gallup, rub or click Lungs:clear without rales, rhonchi, or wheezes ZOX:WRUEAAbd:obese, soft, non tender, + BS, do not palpate liver spleen or masses Ext:no lower ext edema, 2+ pedal pulses, 2+ radial pulses Neuro:alert and oriented X 3, MAE, follows  commands, + facial symmetry    EKG:  EKG is ordered today. The ekg ordered today demonstrates SR no acute changes.     Recent Labs: No results found for requested labs within last 8760 hours.    Lipid Panel No results found for: CHOL, TRIG, HDL, CHOLHDL, VLDL, LDLCALC, LDLDIRECT     Other studies Reviewed: Additional studies/ records that were reviewed today include: PCP OV note..   ASSESSMENT AND PLAN:  1.  Chest pain, most likely muscular skeletal and in combination with HTN and GERD.  Today much improved.  Pt would like to exercise so we will do ETT to eval for ischemia.  This will also give us an idea of BP with exertion.  Discussed with Dr. Jens Somrenshaw.  If test + he will follow up with Dr. Velta AddisonB. Crenshaw otherwise if normal will call results and he can follow up PRN or in 2 mos with Dr. Velta AddisonB. Crenshaw.   2.  Most likely sleep apnea.  Pt will think about study.  He will discuss with his PCP.    3.  HTN controlled today.     4. Morbid obesity discussed poss. Sleep apnea, diet and exercise and brief gastric bypass.   Current medicines are reviewed with the patient today.  The patient Has no concerns regarding medicines.  The following changes have been made:  See above Labs/ tests ordered today include:see above  Disposition:   FU:  see above  Signed, Nada BoozerLaura Etheleen Valtierra, NP Dr. Velta AddisonB. Crenshaw  11/05/2016 11:13 AM    [facility] [address], [place], [place]  [address]/ [address] [place], [place] Phone: [phone]) [phone]; Fax: [phone]  [phone]

## 2016-11-05 ENCOUNTER — Encounter: Payer: Self-pay | Admitting: Cardiology

## 2016-11-05 ENCOUNTER — Ambulatory Visit (INDEPENDENT_AMBULATORY_CARE_PROVIDER_SITE_OTHER): Payer: PRIVATE HEALTH INSURANCE | Admitting: Cardiology

## 2016-11-05 VITALS — BP 120/80 | HR 75 | Ht 72.0 in | Wt >= 6400 oz

## 2016-11-05 DIAGNOSIS — R079 Chest pain, unspecified: Secondary | ICD-10-CM | POA: Diagnosis not present

## 2016-11-05 DIAGNOSIS — I10 Essential (primary) hypertension: Secondary | ICD-10-CM | POA: Diagnosis not present

## 2016-11-05 DIAGNOSIS — G4733 Obstructive sleep apnea (adult) (pediatric): Secondary | ICD-10-CM | POA: Diagnosis not present

## 2016-11-05 NOTE — Patient Instructions (Signed)
Medication Instructions:  Your physician recommends that you continue on your current medications as directed. Please refer to the Current Medication list given to you today.   Labwork: NONE  Testing/Procedures: 1. Your physician has requested that you have an exercise tolerance test. For further information please visit [email or web address]. Please also follow instruction sheet, as given.    Follow-Up: AS NEEDED AT THIS TIME, PENDING YOUR STRESS RESULTS  Any Other Special Instructions Will Be Listed Below (If Applicable).     If you need a refill on your cardiac medications before your next appointment, please call your pharmacy.

## 2016-11-13 ENCOUNTER — Inpatient Hospital Stay (HOSPITAL_COMMUNITY): Admission: RE | Admit: 2016-11-13 | Payer: PRIVATE HEALTH INSURANCE | Source: Ambulatory Visit

## 2016-11-27 ENCOUNTER — Ambulatory Visit (INDEPENDENT_AMBULATORY_CARE_PROVIDER_SITE_OTHER): Payer: PRIVATE HEALTH INSURANCE | Admitting: Internal Medicine

## 2016-11-27 ENCOUNTER — Encounter: Payer: Self-pay | Admitting: Internal Medicine

## 2016-11-27 VITALS — BP 160/98 | HR 78 | Ht 72.0 in | Wt >= 6400 oz

## 2016-11-27 DIAGNOSIS — R079 Chest pain, unspecified: Secondary | ICD-10-CM | POA: Diagnosis not present

## 2016-11-27 DIAGNOSIS — E782 Mixed hyperlipidemia: Secondary | ICD-10-CM

## 2016-11-27 DIAGNOSIS — K219 Gastro-esophageal reflux disease without esophagitis: Secondary | ICD-10-CM | POA: Insufficient documentation

## 2016-11-27 DIAGNOSIS — G4733 Obstructive sleep apnea (adult) (pediatric): Secondary | ICD-10-CM | POA: Diagnosis not present

## 2016-11-27 MED ORDER — NEBIVOLOL HCL 20 MG PO TABS: 20.0000 mg | ORAL_TABLET | Freq: Every day | ORAL | 3 refills | Status: AC

## 2016-11-27 NOTE — Patient Instructions (Signed)
Medication Instructions:  Please stop taking Metoprolol Start taking Bystolic 20 mg daily   Follow-Up: Your physician recommends that you schedule a follow-up appointment in: ONE MONTH WITH DR. HILTY   Any Additional Special Instructions Will Be Listed Below (If Applicable).   How to Use Compression Stockings Compression stockings are elastic socks that squeeze the legs. They help to increase blood flow to the legs, decrease swelling in the legs, and reduce the chance of developing blood clots in the lower legs. Compression stockings are often used by people who:  Are recovering from surgery.  Have poor circulation in their legs.  Are prone to getting blood clots in their legs.  Have varicose veins.  Sit or stay in bed for long periods of time. How to use compression stockings Before you put on your compression stockings:  Make sure that they are the correct size. If you do not know your size, ask your health care provider.  Make sure that they are clean, dry, and in good condition.  Check them for rips and tears. Do not put them on if they are ripped or torn. Put your stockings on first thing in the morning, before you get out of bed. Keep them on for as long as your health care provider advises. When you are wearing your stockings:  Keep them as smooth as possible. Do not allow them to bunch up. It is especially important to prevent the stockings from bunching up around your toes or behind your knees.  Do not roll the stockings downward and leave them rolled down. This can decrease blood flow to your leg.  Change them right away if they become wet or dirty. 1.  When you take off your stockings, inspect your legs and feet. Anything that does not seem normal may require medical attention. Look for:  Open sores.  Red spots.  Swelling. Information and tips  Do not stop wearing your compression stockings without talking to your health care provider first.  Wash your  stockings every day with mild detergent in cold or warm water. Do not use bleach. Air-dry your stockings or dry them in a clothes dryer on low heat.  Replace your stockings every 3-6 months.  If skin moisturizing is part of your treatment plan, apply lotion or cream at night so that your skin will be dry when you put on the stockings in the morning. It is harder to put the stockings on when you have lotion on your legs or feet. Contact a health care provider if: Remove your stockings and seek medical care if:  You have a feeling of pins and needles in your feet or legs.  You have any new changes in your skin.  You have skin lesions that are getting worse.  You have swelling or pain that is getting worse. Get help right away if:  You have numbness or tingling in your lower legs that does not get better right after you take the stockings off.  Your toes or feet become cold and blue.  You develop open sores or red spots on your legs that do not go away.  You see or feel a warm spot on your leg.  You have new swelling or soreness in your leg.  You are short of breath or you have chest pain for no reason.  You have a rapid or irregular heartbeat.  You feel light-headed or dizzy. This information is not intended to replace advice given to you by your health  care provider. Make sure you discuss any questions you have with your health care provider. Document Released: 06/24/2009 Document Revised: 01/25/2016 Document Reviewed: 08/04/2014 Elsevier Interactive Patient Education  2017 [employer]     If you need a refill on your cardiac medications before your next appointment, please call your pharmacy.

## 2016-11-27 NOTE — Progress Notes (Signed)
OFFICE NOTE  Chief Complaint:  Chest discomfort  Primary Care Physician: Alysia PennaHOLWERDA, SCOTT, MD  HPI:  Howard Thomas is a 36 y.o. male who was seen recently by Theodore Demarkhonda Barrett, PA-C, and presented for chest pressure and lt arm burning and across chest.  Referred by B. Kallie Locksurbeville, NP.   He had just started working out and he developed pressure under Lt breast first then later pain from both shoulder and across chest.  He thught was due to pain from exercise.  Asked medical history significant for hypertension, near super morbid obesity, dyslipidemia and gout on Uloric. An exercise treadmill stress test was recommended however his weight exceeded the parameters for the treadmill machine. He also exceeds a parameters for nuclear stress testing. Since his last office visit, he reports that he has never gotten back to how he was feeling prior to exercise. He still feels a low-grade discomfort. He was placed on a PPI which has helped his reflux but he still has symptoms of intermittent tightness in the left chest which is oftentimes is at rest and not necessarily with exertion. He also occasionally gets headaches and also has pain in his legs particularly in the back of his legs when he is extending them as they feel tight. He gets swelling of the lower extremities as well. Blood pressure today is not well controlled and was elevated at 160/98. I reviewed a log of his blood pressures at home which indicate blood pressure is somewhere around 150 systolic on average. 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  PMHx:  Past Medical History:  Diagnosis Date  . Diabetes (HCC)    12/06/14  pt denies  . Dyslipidemia   . GERD (gastroesophageal reflux disease)   . Gout   . H/O seasonal allergies   .  Hypertension   . Joint pain   . Morbid obesity (HCC)   . Paresthesia   . Pilonidal cyst     Past Surgical History:  Procedure Laterality Date  . PILONIDAL CYST EXCISION N/A 12/10/2014   Procedure: CYST EXCISION PILONIDAL EXTENSIVE;  Surgeon: Luretha MurphyMatthew Martin, MD;  Location: [facility];  Service: General;  Laterality: N/A;  . THROAT SURGERY  2012   exc vocal cord lesion    FAMHx:  Family History  Problem Relation Age of Onset  . Hyperlipidemia Maternal Grandmother   . Hypertension Mother   . CVA Father     SOCHx:   reports that he has never smoked. He has never used smokeless tobacco. He reports that he does not drink alcohol or use drugs.  ALLERGIES:  No Known Allergies  ROS: Pertinent items noted in HPI and remainder of comprehensive ROS otherwise negative.  HOME MEDS: Current Outpatient Prescriptions on File Prior to Visit  Medication Sig Dispense Refill  . amLODipine-benazepril (LOTREL) 10-40 MG per capsule Take 1 capsule by mouth at bedtime.     . Febuxostat (ULORIC) 80 MG TABS Take 1 tablet by mouth at bedtime.     . hydrochlorothiazide (HYDRODIURIL) 25 MG tablet Take 25 mg by mouth daily.    . Multiple Vitamin (MULTIVITAMIN WITH MINERALS) TABS tablet Take 1 tablet by mouth daily.    . pantoprazole (PROTONIX) 40 MG tablet Take 40 mg by mouth daily.  No current facility-administered medications on file prior to visit.     LABS/IMAGING: No results found for this or any previous visit (from the past 48 hour(s)). No results found.  WEIGHTS: Wt Readings from Last 3 Encounters:  11/27/16 (!) 483 lb (219.1 kg)  11/05/16 (!) 489 lb 9.6 oz (222.1 kg)  12/10/14 (!) 462 lb (209.6 kg)    VITALS: BP (!) 160/98   Pulse 78   Ht 6' (1.829 m)   Wt (!) 483 lb (219.1 kg)   BMI 65.51 kg/m   EXAM: General appearance: alert and morbidly obese Neck: no carotid bruit, no JVD and thick neck >17 cm Lungs: diminished breath sounds bilaterally Heart: regular rate and  rhythm Abdomen: morbidly obese, cannot palpate organs Extremities: edema trace to 1+ bilateral sock-line edema Pulses: 2+ and symmetric Skin: Skin color, texture, turgor normal. No rashes or lesions Neurologic: Grossly normal Psych: Mildly anxious  EKG: Deferred  ASSESSMENT: 1. Near super morbid obesity 2. Atypical left chest discomfort that waxes and wanes 3. Dyspnea with exertion 4. Hypertension-uncontrolled 5. Dyslipidemia 6. Gout  PLAN: 1.   Mr. Slape continues to have what sounds like atypical chest discomfort which comes and goes and can be associated with exertion or present at rest. It does not seem to improve improved with a PPI as is being treated for reflux although his reflux symptoms have improved. I suspect this could be related to chest wall compression perhaps due to his excess weight or perhaps a pleural pain related to some basilar atelectasis as he is noted to be breathe shallowly. He does have some lower strandy swelling which is likely attributable to venous hypertension and amlodipine. I recommended lower extremity bilateral knee-high compression stockings. Blood pressure is still not at goal. As metoprolol is not particular effective in the African-American population, recommend switching to Bystolic 20 mg daily. Should continue his additional blood pressure medicines. We'll see if this helps with some of his symptoms. I've encouraged him to do some light to moderate exercise, namely walking and no strenuous lifting at this time.  Follow-up with me in 1 month.  Chrystie Nose, MD, [facility] Attending Cardiologist [facility]  Chrystie Nose 11/27/2016, 10:35 AM

## 2017-01-08 ENCOUNTER — Ambulatory Visit: Payer: PRIVATE HEALTH INSURANCE | Admitting: Internal Medicine

## 2017-01-08 ENCOUNTER — Encounter: Payer: Self-pay | Admitting: *Deleted

## 2017-12-12 IMAGING — CR DG LUMBAR SPINE COMPLETE 4+V
5 series · 5 of 5 positions shown · non-contrast
Comparison: No recent prior.

CLINICAL DATA: Low back pain.

EXAM:
LUMBAR SPINE - COMPLETE 4+ VIEW

[w lumbar spine ap]
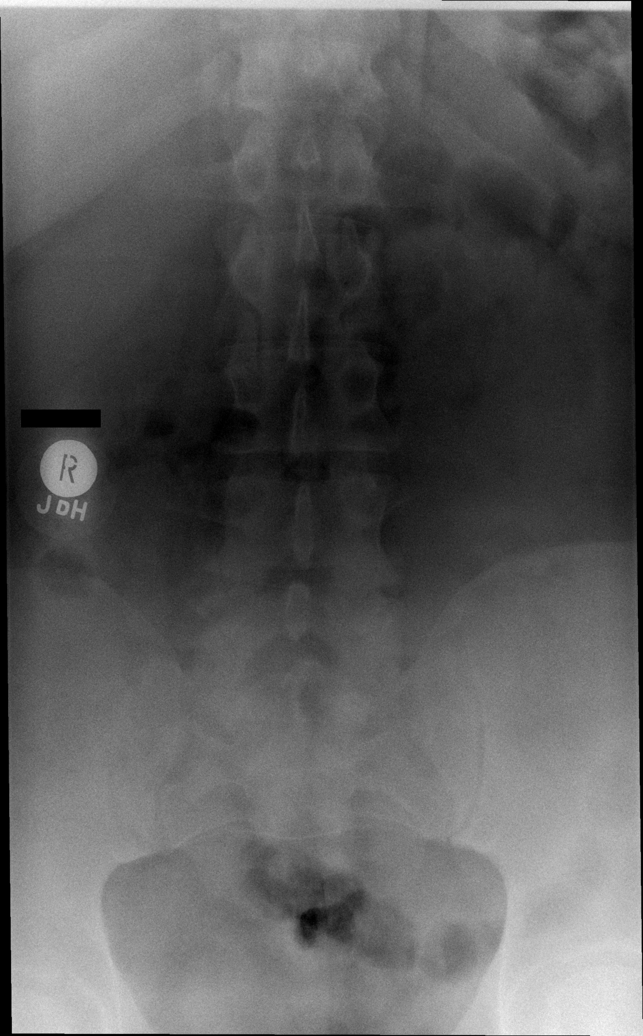

[w lumbar spine obl (1 of 2)]
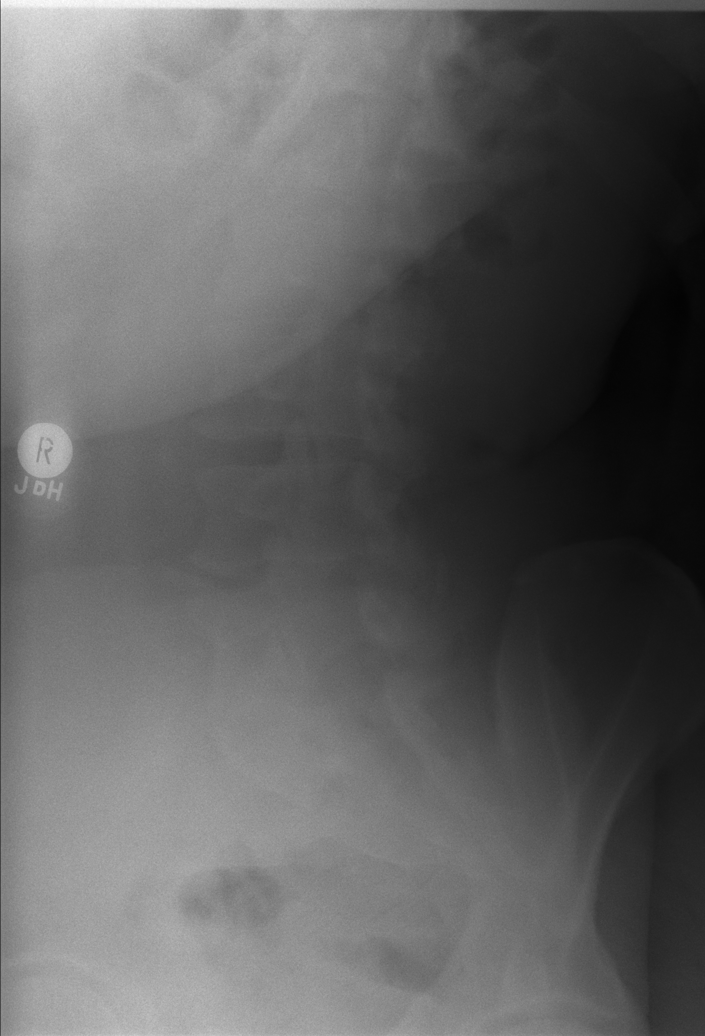

[w lumbar spine obl (2 of 2)]
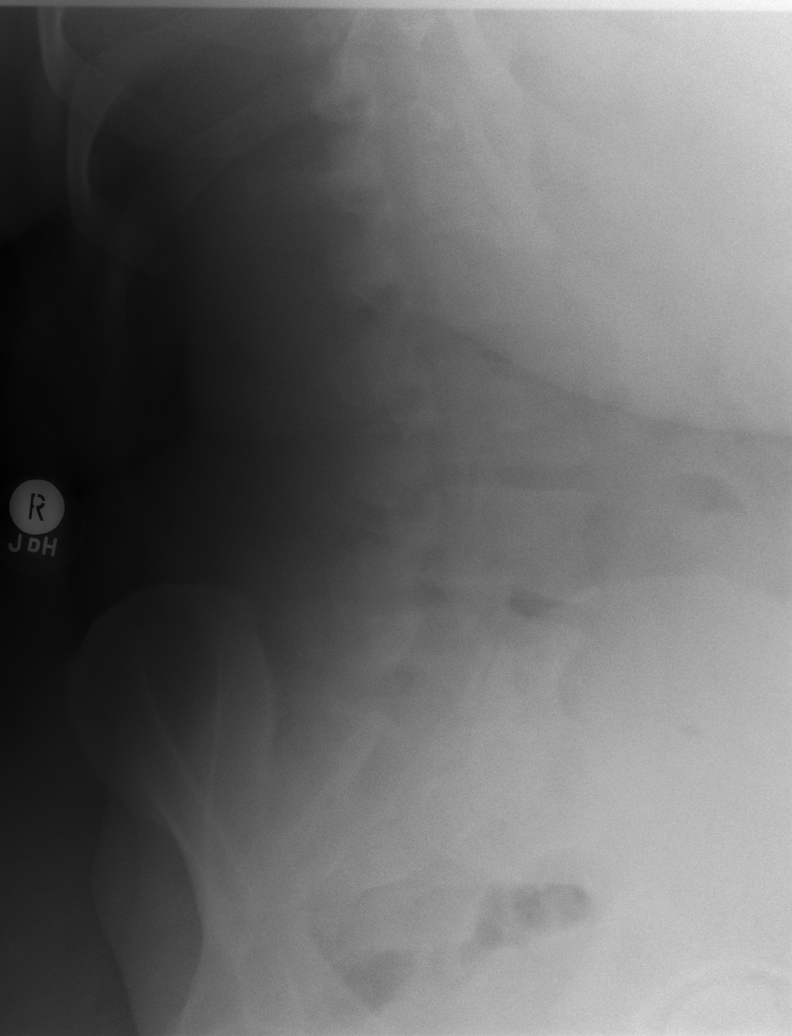

[w lumbar spine lat]
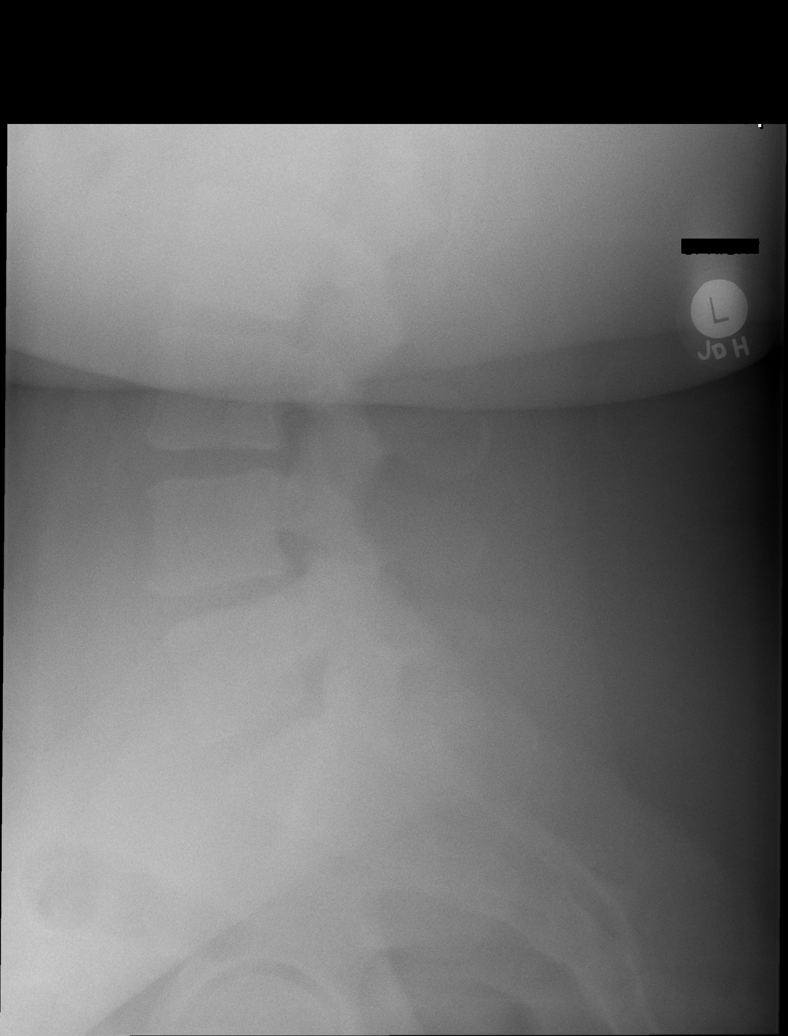

[w lumbar l-5 s-1 spot]
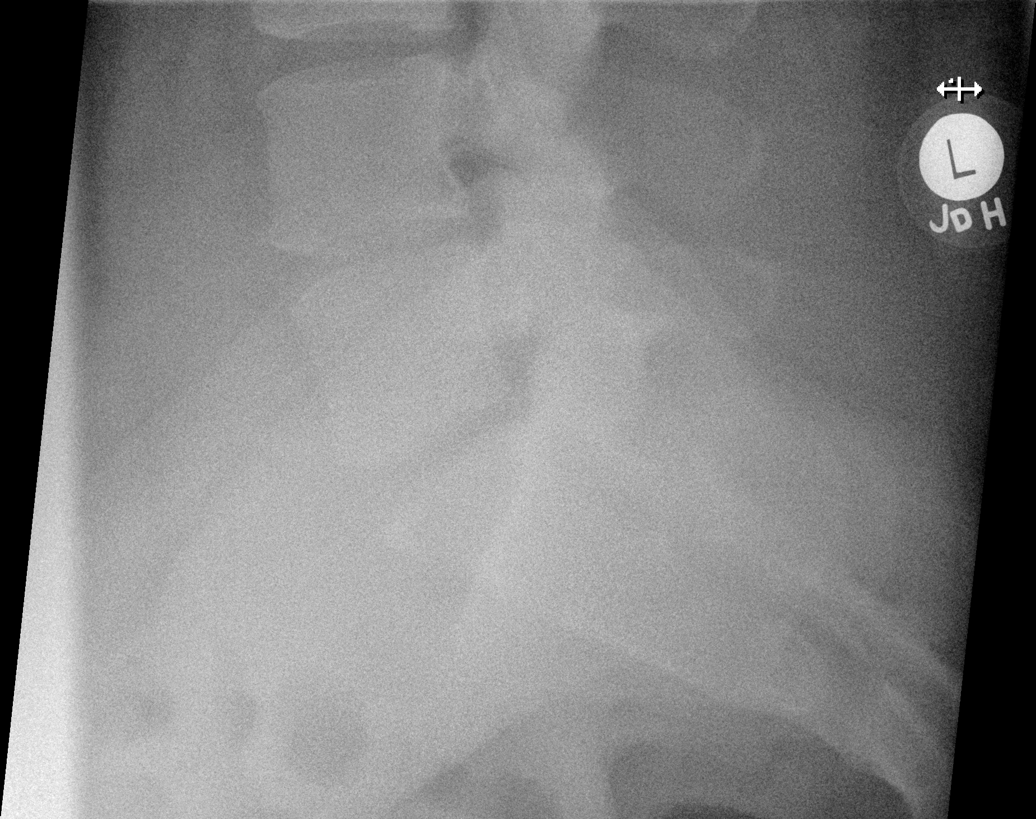

[5 of 5 positions shown; findings below may reference images not displayed]

FINDINGS: No acute or focal abnormality. Normal mineralization. Normal
alignment.
IMPRESSION: No acute or focal abnormality.

## 2018-04-21 ENCOUNTER — Encounter: Payer: Self-pay | Admitting: Neurology

## 2018-04-21 ENCOUNTER — Institutional Professional Consult (permissible substitution): Payer: BLUE CROSS/BLUE SHIELD | Admitting: Neurology

## 2018-04-21 ENCOUNTER — Telehealth: Payer: Self-pay

## 2018-04-21 NOTE — Telephone Encounter (Signed)
Pt did not show for their appt with Dr. Athar today.  

## 2018-05-09 ENCOUNTER — Encounter: Payer: Self-pay | Admitting: *Deleted

## 2018-05-09 ENCOUNTER — Ambulatory Visit: Payer: PRIVATE HEALTH INSURANCE | Admitting: Internal Medicine

## 2018-05-09 DIAGNOSIS — R0989 Other specified symptoms and signs involving the circulatory and respiratory systems: Secondary | ICD-10-CM

## 2019-11-21 ENCOUNTER — Ambulatory Visit: Payer: PRIVATE HEALTH INSURANCE | Attending: Internal Medicine

## 2019-11-21 DIAGNOSIS — Z23 Encounter for immunization: Secondary | ICD-10-CM

## 2019-11-21 NOTE — Progress Notes (Signed)
   Covid-19 Vaccination Clinic  Name:  Howard Thomas    MRN: [id number] DOB: Nov 09, 1980  11/21/2019  [name] was observed post Covid-19 immunization for 15 minutes without incident. He was provided with Vaccine Information Sheet and instruction to access the V-Safe system.   [name] was instructed to call 911 with any severe reactions post vaccine: Marland Kitchen Difficulty breathing  . Swelling of face and throat  . A fast heartbeat  . A bad rash all over body  . Dizziness and weakness   Immunizations Administered    Name Date Dose VIS Date Route   Pfizer COVID-19 Vaccine 11/21/2019 12:04 PM 0.3 mL 08/21/2019 Intramuscular   Manufacturer: [employer], [employer]   Lot: [id number]   NDC: [address]-3

## 2019-12-15 ENCOUNTER — Ambulatory Visit: Payer: PRIVATE HEALTH INSURANCE | Attending: Internal Medicine

## 2019-12-15 DIAGNOSIS — Z23 Encounter for immunization: Secondary | ICD-10-CM

## 2019-12-15 NOTE — Progress Notes (Signed)
   Covid-19 Vaccination Clinic  Name:  Howard Thomas    MRN: [id number] DOB: Apr 17, 1981  12/15/2019  [name] was observed post Covid-19 immunization for 15 minutes without incident. He was provided with Vaccine Information Sheet and instruction to access the V-Safe system.   [name] was instructed to call 911 with any severe reactions post vaccine: Marland Kitchen Difficulty breathing  . Swelling of face and throat  . A fast heartbeat  . A bad rash all over body  . Dizziness and weakness   Immunizations Administered    Name Date Dose VIS Date Route   Pfizer COVID-19 Vaccine 12/15/2019 11:48 AM 0.3 mL 08/21/2019 Intramuscular   Manufacturer: [employer], [employer]   Lot: [id number]   NDC: [address]-0

## 2023-08-28 ENCOUNTER — Other Ambulatory Visit: Payer: Self-pay

## 2023-08-28 ENCOUNTER — Emergency Department (HOSPITAL_COMMUNITY)
Admission: EM | Admit: 2023-08-28 | Discharge: 2023-08-28 | Discharge status: Against Medical Advice | Payer: BC Managed Care – PPO | Attending: Emergency Medicine | Admitting: Emergency Medicine

## 2023-08-28 ENCOUNTER — Emergency Department (HOSPITAL_COMMUNITY): Payer: BC Managed Care – PPO

## 2023-08-28 DIAGNOSIS — R0602 Shortness of breath: Secondary | ICD-10-CM | POA: Diagnosis present

## 2023-08-28 DIAGNOSIS — Z5321 Procedure and treatment not carried out due to patient leaving prior to being seen by health care provider: Secondary | ICD-10-CM | POA: Insufficient documentation

## 2023-08-28 LAB — COMPREHENSIVE METABOLIC PANEL
ALT: 21 U/L (ref 0–44)
AST: 27 U/L (ref 15–41)
Albumin: 3.4 g/dL — ABNORMAL LOW (ref 3.5–5.0)
Alkaline Phosphatase: 60 U/L (ref 38–126)
Anion gap: 9 (ref 5–15)
BUN: 24 mg/dL — ABNORMAL HIGH (ref 6–20)
CO2: 26 mmol/L (ref 22–32)
Calcium: 8.7 mg/dL — ABNORMAL LOW (ref 8.9–10.3)
Chloride: 106 mmol/L (ref 98–111)
Creatinine, Ser: 1.73 mg/dL — ABNORMAL HIGH (ref 0.61–1.24)
GFR, Estimated: 50 mL/min — ABNORMAL LOW (ref >60)
Glucose, Bld: 143 mg/dL — ABNORMAL HIGH (ref 70–99)
Potassium: 4 mmol/L (ref 3.5–5.1)
Sodium: 141 mmol/L (ref 135–145)
Total Bilirubin: 1 mg/dL (ref <1.2)
Total Protein: 6.7 g/dL (ref 6.5–8.1)

## 2023-08-28 LAB — CBC WITH DIFFERENTIAL/PLATELET
Abs Immature Granulocytes: 0.01 10*3/uL (ref 0.00–0.07)
Basophils Absolute: 0 10*3/uL (ref 0.0–0.1)
Basophils Relative: 1 %
Eosinophils Absolute: 0.2 10*3/uL (ref 0.0–0.5)
Eosinophils Relative: 2 %
HCT: 50.8 % (ref 39.0–52.0)
Hemoglobin: 16.3 g/dL (ref 13.0–17.0)
Immature Granulocytes: 0 %
Lymphocytes Relative: 34 %
Lymphs Abs: 2.3 10*3/uL (ref 0.7–4.0)
MCH: 26.6 pg (ref 26.0–34.0)
MCHC: 32.1 g/dL (ref 30.0–36.0)
MCV: 83 fL (ref 80.0–100.0)
Monocytes Absolute: 0.8 10*3/uL (ref 0.1–1.0)
Monocytes Relative: 12 %
Neutro Abs: 3.4 10*3/uL (ref 1.7–7.7)
Neutrophils Relative %: 51 %
Platelets: 226 10*3/uL (ref 150–400)
RBC: 6.12 MIL/uL — ABNORMAL HIGH (ref 4.22–5.81)
RDW: 14 % (ref 11.5–15.5)
WBC: 6.7 10*3/uL (ref 4.0–10.5)
nRBC: 0 % (ref 0.0–0.2)

## 2023-08-28 MED ORDER — ALBUTEROL SULFATE (2.5 MG/3ML) 0.083% IN NEBU
2.5000 mg | INHALATION_SOLUTION | Freq: Once | RESPIRATORY_TRACT | Status: AC
  Administered 2023-08-28: 2.5 mg via RESPIRATORY_TRACT
  Filled 2023-08-28: qty 3

## 2023-08-28 NOTE — ED Triage Notes (Signed)
Patient reports SOB this morning , denies cough or fever .

## 2023-08-28 NOTE — ED Notes (Signed)
Pt walked out of the ED, and said he was leaving.

## 2024-02-28 ENCOUNTER — Emergency Department (HOSPITAL_COMMUNITY)

## 2024-02-28 ENCOUNTER — Other Ambulatory Visit: Payer: Self-pay

## 2024-02-28 ENCOUNTER — Encounter (HOSPITAL_COMMUNITY): Payer: Self-pay

## 2024-02-28 ENCOUNTER — Emergency Department (HOSPITAL_COMMUNITY)
Admission: EM | Admit: 2024-02-28 | Discharge: 2024-02-29 | Disposition: A | Discharge status: Home / Self Care | Attending: Emergency Medicine | Admitting: Emergency Medicine

## 2024-02-28 DIAGNOSIS — Z79899 Other long term (current) drug therapy: Secondary | ICD-10-CM | POA: Insufficient documentation

## 2024-02-28 DIAGNOSIS — E119 Type 2 diabetes mellitus without complications: Secondary | ICD-10-CM | POA: Diagnosis not present

## 2024-02-28 DIAGNOSIS — I10 Essential (primary) hypertension: Secondary | ICD-10-CM | POA: Insufficient documentation

## 2024-02-28 DIAGNOSIS — R600 Localized edema: Secondary | ICD-10-CM | POA: Insufficient documentation

## 2024-02-28 LAB — URINALYSIS, ROUTINE W REFLEX MICROSCOPIC
Bacteria, UA: NONE SEEN
Bilirubin Urine: NEGATIVE
Glucose, UA: >=500 mg/dL — AB
Hgb urine dipstick: NEGATIVE
Ketones, ur: NEGATIVE mg/dL
Leukocytes,Ua: NEGATIVE
Nitrite: NEGATIVE
Protein, ur: >=300 mg/dL — AB
Specific Gravity, Urine: 1.018 (ref 1.005–1.030)
pH: 6 (ref 5.0–8.0)

## 2024-02-28 LAB — CBC
HCT: 52.8 % — ABNORMAL HIGH (ref 39.0–52.0)
Hemoglobin: 16.6 g/dL (ref 13.0–17.0)
MCH: 26 pg (ref 26.0–34.0)
MCHC: 31.4 g/dL (ref 30.0–36.0)
MCV: 82.6 fL (ref 80.0–100.0)
Platelets: 215 10*3/uL (ref 150–400)
RBC: 6.39 MIL/uL — ABNORMAL HIGH (ref 4.22–5.81)
RDW: 14.4 % (ref 11.5–15.5)
WBC: 6.6 10*3/uL (ref 4.0–10.5)
nRBC: 0 % (ref 0.0–0.2)

## 2024-02-28 LAB — BASIC METABOLIC PANEL WITH GFR
Anion gap: 11 (ref 5–15)
BUN: 22 mg/dL — ABNORMAL HIGH (ref 6–20)
CO2: 23 mmol/L (ref 22–32)
Calcium: 8.8 mg/dL — ABNORMAL LOW (ref 8.9–10.3)
Chloride: 106 mmol/L (ref 98–111)
Creatinine, Ser: 1.52 mg/dL — ABNORMAL HIGH (ref 0.61–1.24)
GFR, Estimated: 58 mL/min — ABNORMAL LOW (ref >60)
Glucose, Bld: 106 mg/dL — ABNORMAL HIGH (ref 70–99)
Potassium: 3.8 mmol/L (ref 3.5–5.1)
Sodium: 140 mmol/L (ref 135–145)

## 2024-02-28 LAB — BRAIN NATRIURETIC PEPTIDE: B Natriuretic Peptide: 29.3 pg/mL (ref 0.0–100.0)

## 2024-02-28 MED ORDER — HYDRALAZINE HCL 10 MG PO TABS
10.0000 mg | ORAL_TABLET | Freq: Once | ORAL | Status: AC
  Administered 2024-02-28: 10 mg via ORAL
  Filled 2024-02-28: qty 1

## 2024-02-28 NOTE — ED Provider Notes (Signed)
 [place] EMERGENCY DEPARTMENT AT [facility] Provider Note   CSN: [id number] Arrival date & time: 02/28/24  2117     Patient presents with: Hypertension   Howard Thomas is a 43 y.o. male with history of diabetes, GERD, gout, hypertension, morbid obesity.  The patient presents to the ED for evaluation of hypertension.  Reports that today he developed a slight headache rated 1 out of 10 to his frontal lobe described as skin tightening.  Reports that this headache is not present.  States that his headache prompted him to check his blood pressure and noted it was over 200 systolic.  The patient reports that he does not regularly check his blood pressure so he is unsure of his average.  He states he is compliant on his blood pressure medication at home.  Reports that he was recently taking off of losartan and placed on amlodipine-benazepril 10-40.  He reports he was also recently placed on Jardiance.  He denies any chest pain, shortness of breath, blurred vision.  Reports that current headache no longer present.   Hypertension Associated symptoms include headaches. Pertinent negatives include no chest pain and no shortness of breath.       Prior to Admission medications   Medication Sig Start Date End Date Taking? Authorizing Provider  amLODipine-benazepril (LOTREL) 10-40 MG per capsule Take 1 capsule by mouth at bedtime.     [provider]  Febuxostat (ULORIC) 80 MG TABS Take 1 tablet by mouth at bedtime.     [provider]  hydrochlorothiazide (HYDRODIURIL) 25 MG tablet Take 25 mg by mouth daily.    [provider]  Multiple Vitamin (MULTIVITAMIN WITH MINERALS) TABS tablet Take 1 tablet by mouth daily.    [provider]  Nebivolol  HCl (BYSTOLIC ) 20 MG TABS Take 1 tablet (20 mg total) by mouth daily. 11/27/16   Hilty, Vinie BROCKS, MD  pantoprazole (PROTONIX) 40 MG tablet Take 40 mg by mouth daily.    [provider]    Allergies:  Patient has no known allergies.    Review of Systems  Eyes:  Negative for visual disturbance.  Respiratory:  Negative for shortness of breath.   Cardiovascular:  Negative for chest pain.  Neurological:  Positive for headaches.  All other systems reviewed and are negative.   Updated Vital Signs BP (!) 168/107   Pulse 66   Temp 98.6 F (37 C)   Resp 18   Ht 6' (1.829 m)   Wt (!) 221.4 kg   SpO2 98%   BMI 66.18 kg/m   Physical Exam Vitals and nursing note reviewed.  Constitutional:      General: He is not in acute distress.    Appearance: He is well-developed.  HENT:     Head: Normocephalic and atraumatic.   Eyes:     Conjunctiva/sclera: Conjunctivae normal.    Cardiovascular:     Rate and Rhythm: Normal rate and regular rhythm.     Heart sounds: No murmur heard.    Comments: 1+ nonpitting edema bilaterally Pulmonary:     Effort: Pulmonary effort is normal. No respiratory distress.     Breath sounds: Normal breath sounds.  Abdominal:     Palpations: Abdomen is soft.     Tenderness: There is no abdominal tenderness.   Musculoskeletal:        General: No swelling.     Cervical back: Neck supple.     Right lower leg: 1+ Edema present.  Left lower leg: 1+ Edema present.   Skin:    General: Skin is warm and dry.     Capillary Refill: Capillary refill takes less than 2 seconds.   Neurological:     Mental Status: He is alert and oriented to person, place, and time. Mental status is at baseline.     Comments: CN III through XII intact.  Intact finger-nose, heel-to-shin.  No pronator drift, no slurred speech, no facial droop.  Equal strength throughout.  Tracks across midline.  PERRL.  Psychiatric:        Mood and Affect: Mood normal.     (all labs ordered are listed, but only abnormal results are displayed) Labs Reviewed  CBC - Abnormal; Notable for the following components:      Result Value   RBC 6.39 (*)    HCT 52.8 (*)    All other components within  normal limits  BASIC METABOLIC PANEL WITH GFR - Abnormal; Notable for the following components:   Glucose, Bld 106 (*)    BUN 22 (*)    Creatinine, Ser 1.52 (*)    Calcium 8.8 (*)    GFR, Estimated 58 (*)    All other components within normal limits  URINALYSIS, ROUTINE W REFLEX MICROSCOPIC - Abnormal; Notable for the following components:   Glucose, UA >=500 (*)    Protein, ur >=300 (*)    All other components within normal limits  BRAIN NATRIURETIC PEPTIDE    EKG: EKG Interpretation Date/Time:  Friday February 28 2024 21:58:45 EDT Ventricular Rate:  71 PR Interval:  196 QRS Duration:  103 QT Interval:  410 QTC Calculation: 446 R Axis:   -20  Text Interpretation: Sinus rhythm Borderline left axis deviation RSR' in V1 or V2, probably normal variant nonspecific changes Confirmed by Freddi Hamilton [phone]) on 02/28/2024 11:06:47 PM  Radiology: DG Chest 1 View Result Date: 02/28/2024 CLINICAL DATA:  Hypoxia EXAM: CHEST  1 VIEW COMPARISON:  08/28/2023 FINDINGS: Cardiomegaly with vascular congestion. No acute airspace disease, pleural effusion or pneumothorax IMPRESSION: Cardiomegaly with vascular congestion. Electronically Signed   By: Luke Bun M.D.   On: 02/28/2024 22:15     Procedures   Medications Ordered in the ED  hydrALAZINE  (APRESOLINE ) tablet 10 mg (10 mg Oral Given 02/28/24 2243)       Medical Decision Making Amount and/or Complexity of Data Reviewed Labs: ordered. Radiology: ordered.   43 year old male presents for evaluation.  Please see HPI for further details.  On examination the patient is afebrile and nontachycardic.  His lung sounds are clear bilaterally, he is not hypoxic.  Abdomen is soft and compressible.  Neurological examination at baseline without focal neurodeficits.  Patient blood pressure 168/107.  Denies any symptoms.  Reports a light headache rated 1 out of 10.  Patient lab work initiated in triage include CBC, BMP, urinalysis, chest x-Waggle,  EKG.  I have added on BNP.  Patient given 10 mg hydralazine .  CBC without leukocytosis or anemia.  Metabolic panel with a baseline creatinine 1.52, anion gap 11, no electrolyte derangement.  Patient urinalysis shows glucose and protein.  BNP 29.3.  Chest x-Kruk unremarkable.  EKG nonischemic.  Chest xray ordered by provider in triage due to initial oxygen saturation of 89% RA.  Patient has had no repeat episodes of this.  Suspect this was an error.  Patient blood pressure was reevaluated after getting 10 mg hydralazine .  Patient cuff was also replaced with a properly sized cuff and his blood pressure  is currently 135/83.  Patient chart reviewed.  Back in 2018, the patient was noted by his cardiologist have an average blood pressure of 150 systolic.  Patient blood pressure currently 135/83.  At this time, patient will be discharged home.  He will follow-up with his PCP.  He was advised to continue to monitoring his blood pressure at home and he voiced understanding.  Encouraged to return to the ED in the setting of chest pain, shortness of breath, headache or blurred vision.  Patient stable to discharge home.     Final diagnoses:  Asymptomatic hypertension    ED Discharge Orders     None          Candler, [place] 02/28/24 2328    Freddi Hamilton, MD 02/29/24 [phone]

## 2024-02-28 NOTE — Discharge Instructions (Addendum)
 It was a pleasure taking part in your care.  As discussed, please follow-up with your PCP for further evaluation and control your blood pressure.  Please continue to monitor your blood pressure at home.  Utilize blood pressure record sheet attached to this form and write down blood pressures.  Return to ED with any new or worsening symptoms such as chest pain, shortness of breath, headache blurred vision in the setting of high blood pressure.  Please continue taking antihypertensive medication as prescribed.

## 2024-02-28 NOTE — ED Notes (Signed)
 PT STATES HE WAS RECENTLY TAKEN OFF OF LOSARTAN AND PUT ON JARDIANCE INSTEAD

## 2024-02-28 NOTE — ED Provider Triage Note (Signed)
 Emergency Medicine Provider Triage Evaluation Note  Howard Thomas , a 43 y.o. male  was evaluated in triage.  Pt complains of hypertension.  Patient has been checking his blood pressure at home daily, was found to have systolic BP in the 200s at home today.  He does endorse a mild headache that he rates as a 1 out of 10, he has experienced worsening lower extremity edema over the past several months but no worse today, he reports foamy urine but no oliguria.  Denies chest pain, shortness of breath, abdominal pain, lightheadedness/dizziness.  He is compliant with his blood pressure medications.  Review of Systems  Positive: As above Negative: As above  Physical Exam  BP (!) 195/114 (BP Location: Right Wrist)   Pulse 63   Temp 98.6 F (37 C)   Resp 18   SpO2 97%  Gen:   Awake, no distress   Resp:  Normal effort  MSK:   Moves extremities without difficulty  Other:  1+ pitting edema bilateral lower extremities  Medical Decision Making  Medically screening exam initiated at 9:46 PM.  Appropriate orders placed.  Howard Thomas was informed that the remainder of the evaluation will be completed by another provider, this initial triage assessment does not replace that evaluation, and the importance of remaining in the ED until their evaluation is complete.     Howard Thomas, [place] 02/28/24 2147

## 2024-02-28 NOTE — ED Triage Notes (Signed)
 Patient reports elevated blood pressure this evening at home BP=203/101, denies SOB , did not miss his antihypertensive medications.

## 2024-02-29 NOTE — ED Notes (Signed)
 PT D/C'D AFTER INSTRUCTIONS REVIEWED. PT VERBALIZED UNDERSTANDING. NAD REPORTED OR NOTED AT THIS TIME.

## 2024-03-23 ENCOUNTER — Encounter: Payer: Self-pay | Admitting: Neurology

## 2024-03-23 ENCOUNTER — Ambulatory Visit (INDEPENDENT_AMBULATORY_CARE_PROVIDER_SITE_OTHER): Admitting: Neurology

## 2024-03-23 ENCOUNTER — Encounter: Payer: Self-pay | Admitting: *Deleted

## 2024-03-23 VITALS — BP 157/77 | HR 63 | Ht 72.0 in | Wt >= 6400 oz

## 2024-03-23 DIAGNOSIS — Z9189 Other specified personal risk factors, not elsewhere classified: Secondary | ICD-10-CM | POA: Diagnosis not present

## 2024-03-23 DIAGNOSIS — R030 Elevated blood-pressure reading, without diagnosis of hypertension: Secondary | ICD-10-CM | POA: Diagnosis not present

## 2024-03-23 DIAGNOSIS — R0681 Apnea, not elsewhere classified: Secondary | ICD-10-CM

## 2024-03-23 DIAGNOSIS — R635 Abnormal weight gain: Secondary | ICD-10-CM

## 2024-03-23 DIAGNOSIS — Z6841 Body Mass Index (BMI) 40.0 and over, adult: Secondary | ICD-10-CM

## 2024-03-23 DIAGNOSIS — R0683 Snoring: Secondary | ICD-10-CM

## 2024-03-23 DIAGNOSIS — I517 Cardiomegaly: Secondary | ICD-10-CM

## 2024-03-23 DIAGNOSIS — R351 Nocturia: Secondary | ICD-10-CM

## 2024-03-23 NOTE — Patient Instructions (Signed)

## 2024-03-23 NOTE — Progress Notes (Signed)
 Subjective:    Patient ID: Howard Thomas is a 43 y.o. male.  HPI    True Mar, MD, PhD [facility] [address], [address] [address] [place], [place] [address]   Dear Corean,  I saw your patient, Howard Thomas, upon your kind request in my sleep clinic today for initial consultation of his sleep disorder, in particular, concern for underlying obstructive sleep apnea.  The patient is unaccompanied today.  As you know, Howard Thomas is a 43 year old male with an underlying medical history of cardiomegaly, hypertension, gout, hyperlipidemia, diabetes, and morbid obesity with a BMI of over 60, who reports snoring and excessive daytime somnolence as well as witnessed apneas per wife's report.  His Epworth sleepiness score is 7 out of 24.  I reviewed your office note from 03/02/2024.  About 5 years ago he was able to lose a significant amount of weight, in the realm of 150 pounds but over the course of the past 4 years he gained most of his weight back.  He is not aware of any family history of sleep apnea.  He lives with his family including wife and 2 sons, oldest is at home from college.  He is a 6 grade Editor, commissioning.  He goes to bed around 1130 currently, during the school year he may go to bed around 10.  Rise time is currently around 7:15 AM and during the work year he has a rise time of around 6:15 AM.  He drinks no daily caffeine, he does not drink any alcohol.  He is a non-smoker.  He has significant nocturia about 3 times per average night.  He denies recurrent nocturnal or morning headaches.  He is working on weight loss.  His Past Medical History Is Significant For: Past Medical History:  Diagnosis Date   Diabetes (HCC)    12/06/14  pt denies   Dyslipidemia    GERD (gastroesophageal reflux disease)    Gout    H/O seasonal allergies    Hypertension    Joint pain    Morbid obesity (HCC)    Paresthesia    Pilonidal cyst     His Past Surgical History Is  Significant For: Past Surgical History:  Procedure Laterality Date   PILONIDAL CYST EXCISION N/A 12/10/2014   Procedure: CYST EXCISION PILONIDAL EXTENSIVE;  Surgeon: Donnice Lunger, MD;  Location: [facility];  Service: General;  Laterality: N/A;   THROAT SURGERY  2012   exc vocal cord lesion    His Family History Is Significant For: Family History  Problem Relation Age of Onset   Hypertension Mother    CVA Father    Hyperlipidemia Maternal Grandmother    Sleep apnea Neg Hx     His Social History Is Significant For: Social History   Socioeconomic History   Marital status: Single    Spouse name: Not on file   Number of children: Not on file   Years of education: Not on file   Highest education level: Not on file  Occupational History   Not on file  Tobacco Use   Smoking status: Never   Smokeless tobacco: Never  Vaping Use   Vaping status: Not on file  Substance and Sexual Activity   Alcohol use: No    Alcohol/week: 0.0 standard drinks of alcohol   Drug use: No   Sexual activity: Not on file  Other Topics Concern   Not on file  Social History Narrative   Pt lives with family  Pt works    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   amLODipine-benazepril (LOTREL) 10-40 MG per capsule Take 1 capsule by mouth at bedtime.    empagliflozin (JARDIANCE) 10 MG TABS tablet Take 10 mg by mouth daily.   losartan (COZAAR) 50 MG tablet Take 50 mg by mouth daily.   Multiple Vitamin (MULTIVITAMIN WITH MINERALS) TABS tablet Take 1 tablet by mouth daily.   Nebivolol  HCl (BYSTOLIC ) 20 MG TABS Take 1  tablet (20 mg total) by mouth daily.   pantoprazole (PROTONIX) 40 MG tablet Take 40 mg by mouth daily.   No facility-administered encounter medications on file as of 03/23/2024.  :   Review of Systems:  Out of a complete 14 point review of systems, all are reviewed and negative with the exception of these symptoms as listed below:  Review of Systems  Neurological:        Pt here for sleep consult Pt snores,headaches,fatigue,hypertension Pt denies sleep study,cpap machine   ESS: 7 FSS:    Objective:  Neurological Exam  Physical Exam Physical Examination:   Vitals:   03/23/24 1404  BP: (!) 157/77  Pulse: 63   Denies any symptoms from elevated blood pressure today.  General Examination: The patient is a very pleasant 43 y.o. male in no acute distress. He appears well-developed and well-nourished and well groomed.   HEENT: Normocephalic, atraumatic, pupils are equal, round and reactive to light, extraocular tracking is good without limitation to gaze excursion or nystagmus noted. Hearing is grossly intact. Face is symmetric with normal facial animation. Speech is clear with no dysarthria noted. There is no hypophonia. There is no lip, neck/head, jaw or voice tremor. Neck is supple with full range of passive and active motion. There are no carotid bruits on auscultation. Oropharynx exam reveals: mild mouth dryness, good dental hygiene and marked airway crowding, due to small airway entry, thicker soft palate, tonsils and tip of uvula not fully visualized, Mallampati class III, large tongue.  Tongue protrudes centrally and palate elevates symmetrically.  Neck circumference 21 inches, minimal overbite noted.  Chest: Clear to auscultation without wheezing, rhonchi or crackles noted.  Heart: S1+S2+0, regular and normal without murmurs, rubs or gallops noted.   Abdomen: Soft, non-tender and non-distended.  Extremities: There is puffiness noted around the ankles bilaterally.    Skin:  Warm and dry without trophic changes noted.   Musculoskeletal: exam reveals no obvious joint deformities.   Neurologically:  Mental status: The patient is awake, alert and oriented in all 4 spheres. His immediate and remote memory, attention, language skills and fund of knowledge are appropriate. There is no evidence of aphasia, agnosia, apraxia or anomia. Speech is clear with normal prosody and enunciation. Thought process is linear. Mood is normal and affect is normal.  Cranial nerves II - XII are as described above under HEENT exam.  Motor exam: Normal bulk, moving all 4 extremities without restriction, no obvious action or resting tremor.  Fine motor skills and coordination: grossly intact.  Cerebellar testing: No dysmetria or intention tremor. There is no  truncal or gait ataxia.  Sensory exam: intact to light touch in the upper and lower extremities.  Gait, station and balance: He stands easily. No veering to one side is noted. No leaning to one side is noted. Posture is age-appropriate and stance is narrow based. Gait shows normal stride length and normal pace. No problems turning are noted.   Assessment and Plan:  In summary, Howard Thomas is a very pleasant 43 y.o.-year old male with an underlying medical history of cardiomegaly, hypertension, gout, hyperlipidemia, diabetes, and morbid obesity with a BMI of over 60, whose history and physical exam are concerning for sleep disordered breathing, particularly obstructive sleep apnea (OSA). A laboratory attended sleep study is typically considered gold standard for evaluation of sleep disordered breathing.   I had a long chat with the patient about my findings and the diagnosis of sleep apnea, particularly OSA, its prognosis and treatment options. We talked about medical/conservative treatments, surgical interventions and non-pharmacological approaches for symptom control. I explained, in particular, the risks and ramifications of untreated  moderate to severe OSA, especially with respect to developing cardiovascular disease down the road, including congestive heart failure (CHF), difficult to treat hypertension, cardiac arrhythmias (particularly A-fib), neurovascular complications including TIA, stroke and dementia. Even type 2 diabetes has, in part, been linked to untreated OSA. Symptoms of untreated OSA may include (but may not be limited to) daytime sleepiness, nocturia (i.e. frequent nighttime urination), memory problems, mood irritability and suboptimally controlled or worsening mood disorder such as depression and/or anxiety, lack of energy, lack of motivation, physical discomfort, as well as recurrent headaches, especially morning or nocturnal headaches. We talked about the importance of maintaining a healthy lifestyle and striving for healthy weight. I recommended a sleep study at this time. I outlined the differences between a laboratory attended sleep study which is considered more comprehensive and accurate over the option of a home sleep test (HST); the latter may lead to underestimation of sleep disordered breathing in some instances and does not help with diagnosing upper airway resistance syndrome and is not accurate enough to diagnose primary central sleep apnea typically. I outlined possible surgical and non-surgical treatment options of OSA, including the use of a positive airway pressure (PAP) device (i.e. CPAP, AutoPAP/APAP or BiPAP in certain circumstances), a custom-made dental device (aka oral appliance, which would require a referral to a specialist dentist or orthodontist typically, and is generally speaking not considered for patients with full dentures or edentulous state), upper airway surgical options, such as traditional UPPP (which is not considered a first-line treatment) or the Inspire device (hypoglossal nerve stimulator, which would involve a referral for consultation with an ENT surgeon, after careful selection,  following inclusion criteria - also not first-line treatment). I explained the PAP treatment option to the patient in detail, as this is generally considered first-line treatment.  The patient indicated that he would be willing to try PAP therapy, if the need arises. I explained the importance of being compliant with PAP treatment, not only for insurance purposes but primarily to improve patient's symptoms symptoms, and for the patient's long term health benefit, including to reduce His cardiovascular risks longer-term.    We will pick up our discussion about the next steps and treatment options after testing.  We will keep him posted as to the test results by phone call and/or MyChart messaging where possible.  We will plan to follow-up in sleep clinic accordingly as well.  I answered all his questions today and the patient  was in agreement.   I encouraged him to call with any interim questions, concerns, problems or updates or email us  through MyChart.  Generally speaking, sleep test authorizations may take up to 2 weeks, sometimes less, sometimes longer, the patient is encouraged to get in touch with us  if they do not hear back from the sleep lab staff directly within the next 2 weeks.  Thank you very much for allowing me to participate in the care of this nice patient. If I can be of any further assistance to you please do not hesitate to call me at [phone].  Sincerely,   True Mar, MD, PhD

## 2024-04-01 ENCOUNTER — Telehealth: Payer: Self-pay | Admitting: Neurology

## 2024-04-01 NOTE — Telephone Encounter (Signed)
 [employer] state no auth req   Spoke to the patient.  He is scheduled at [facility] for 04/28/2024 at 8 pm.  Mailed packet and sent mychart.

## 2024-04-28 ENCOUNTER — Ambulatory Visit (INDEPENDENT_AMBULATORY_CARE_PROVIDER_SITE_OTHER): Admitting: Neurology

## 2024-04-28 DIAGNOSIS — R635 Abnormal weight gain: Secondary | ICD-10-CM

## 2024-04-28 DIAGNOSIS — G4720 Circadian rhythm sleep disorder, unspecified type: Secondary | ICD-10-CM

## 2024-04-28 DIAGNOSIS — G4733 Obstructive sleep apnea (adult) (pediatric): Secondary | ICD-10-CM

## 2024-04-28 DIAGNOSIS — R351 Nocturia: Secondary | ICD-10-CM

## 2024-04-28 DIAGNOSIS — R0683 Snoring: Secondary | ICD-10-CM

## 2024-04-28 DIAGNOSIS — R0681 Apnea, not elsewhere classified: Secondary | ICD-10-CM

## 2024-04-28 DIAGNOSIS — G4734 Idiopathic sleep related nonobstructive alveolar hypoventilation: Secondary | ICD-10-CM

## 2024-04-28 DIAGNOSIS — R030 Elevated blood-pressure reading, without diagnosis of hypertension: Secondary | ICD-10-CM

## 2024-04-28 DIAGNOSIS — Z9189 Other specified personal risk factors, not elsewhere classified: Secondary | ICD-10-CM

## 2024-05-05 ENCOUNTER — Ambulatory Visit: Payer: Self-pay | Admitting: Neurology

## 2024-05-05 DIAGNOSIS — G4733 Obstructive sleep apnea (adult) (pediatric): Secondary | ICD-10-CM

## 2024-05-05 NOTE — Procedures (Signed)
 Physician Interpretation: Please see link under Procedure Tab or under Encounters tab for physician report, technical report, as well as O2 titration and/or PAP titration tables (if applicable).   Referred by: Dr. Modena Callander   History and Indication for Testing (obtained from visit note dated 03/12/2024): 43 year old male with an underlying complex medical history of stroke in May 2025, hypertension, hyperlipidemia, coronary artery disease, prior smoking, COPD, history of kidney stone, carotid artery disease with status post bilateral carotid endarterectomies, hypothyroidism, and overweight state, who reports snoring and nocturia. His Epworth sleepiness score is 3 out of 24, fatigue severity score is 15 out of 63.     Review of the EEG showed no abnormal electrical discharges and symmetrical bihemispheric findings.     EKG: The EKG revealed normal sinus rhythm (NSR). ***   AUDIO/VIDEO REVIEW: The audio and video review did not show any abnormal or unusual behaviors, movements, phonations or vocalizations. The patient took *** restroom breaks. Snoring was noted, ***   POST-STUDY QUESTIONNAIRE: Post study, the patient indicated, that sleep was *** the same as usual.    IMPRESSION:    Obstructive Sleep Apnea (OSA), *** ***Central Sleep Apnea (CSA) ***Primary Snoring ***Primary Central Sleep Apnea ***Complex Sleep Apnea ***PLMD (periodic limb movement disorder [of sleep]) ***Dysfunctions associated with sleep stages or arousal from sleep ***Non-specific abnormal electrocardiogram (EKG) ***Poor sleep pattern ***Inconclusive Test   RECOMMENDATIONS:         I certify that I have reviewed the entire raw data recording prior to the issuance of this report in accordance with the Standards of Accreditation of the American Academy of Sleep Medicine (AASM).   True Mar, MD, PhD Medical Director, [facility]) Diplomat, ABPN (Neurology and  Sleep)

## 2024-05-05 NOTE — Telephone Encounter (Signed)
 Pt called back I,nformed that Pt will get call back .

## 2024-05-05 NOTE — Telephone Encounter (Signed)
 Spoke with patient and discussed his sleep study results. He is amenable to urgent setup on autopap. We discussed the insurance compliance requirements. He will f/u on 07/21/24 at 7:45 AM. Answered pt's questions. Order sent to [facility]. Report sent to referring provider.

## 2024-05-06 NOTE — Telephone Encounter (Signed)
 Zott, Glade Boring, Heather CROME, RN; [place], [place]; Salick, Verneita Got it Thank you

## 2024-06-11 ENCOUNTER — Ambulatory Visit: Payer: PRIVATE HEALTH INSURANCE | Attending: Cardiology | Admitting: Internal Medicine

## 2024-06-11 ENCOUNTER — Encounter: Payer: Self-pay | Admitting: Internal Medicine

## 2024-06-11 VITALS — BP 109/67 | HR 57 | Ht 72.0 in | Wt >= 6400 oz

## 2024-06-11 DIAGNOSIS — R0609 Other forms of dyspnea: Secondary | ICD-10-CM

## 2024-06-11 DIAGNOSIS — E1122 Type 2 diabetes mellitus with diabetic chronic kidney disease: Secondary | ICD-10-CM

## 2024-06-11 DIAGNOSIS — G4733 Obstructive sleep apnea (adult) (pediatric): Secondary | ICD-10-CM

## 2024-06-11 DIAGNOSIS — I10 Essential (primary) hypertension: Secondary | ICD-10-CM | POA: Diagnosis not present

## 2024-06-11 DIAGNOSIS — E782 Mixed hyperlipidemia: Secondary | ICD-10-CM

## 2024-06-11 DIAGNOSIS — N1831 Chronic kidney disease, stage 3a: Secondary | ICD-10-CM

## 2024-06-11 NOTE — Patient Instructions (Addendum)
 Testing/Procedures:  Your physician has requested that you have an echocardiogram. Echocardiography is a painless test that uses sound waves to create images of your heart. It provides your doctor with information about the size and shape of your heart and how well your heart's chambers and valves are working. This procedure takes approximately one hour. There are no restrictions for this procedure. Please do NOT wear cologne, perfume, aftershave, or lotions (deodorant is allowed). Please arrive 15 minutes prior to your appointment time.  Please note: We ask at that you not bring children with you during ultrasound (echo/ vascular) testing. Due to room size and safety concerns, children are not allowed in the ultrasound rooms during exams. Our front office staff cannot provide observation of children in our lobby area while testing is being conducted. An adult accompanying a patient to their appointment will only be allowed in the ultrasound room at the discretion of the ultrasound technician under special circumstances. We apologize for any inconvenience. [address]   CARDIAC PET- Your physician has requested that you have a Cardiac Pet Stress Test.   This testing is completed at [facility] ([address] [place], [place] [address]) or [facility] ([address], [place], [place]). Please arrive 30 minutes prior to your scheduled time.  The schedulers will call you to get this scheduled. Please follow further testing instructions below.   Follow-Up: At [facility], you and your health needs are our priority.  As part of our continuing mission to provide you with exceptional heart care, our providers are all part of one team.  This team includes your primary Cardiologist (physician) and Advanced Practice Providers or APPs (Physician Assistants and Nurse Practitioners) who all work together to provide you with the care you need,  when you need it.  Your next appointment:   6 month(s)  Provider:   EMELINE CALENDER   Other Instructions     Please report to Radiology at the [facility] Main Entrance 30 minutes early for your test.  [address] [place], [place] [address]  How to Prepare for Your Cardiac PET/CT Stress Test:  Nothing to eat or drink, except water, 3 hours prior to arrival time.  NO caffeine/decaffeinated products, or chocolate 12 hours prior to arrival. (Please note decaffeinated beverages (teas/coffees) still contain caffeine).  If you have caffeine within 12 hours prior, the test will need to be rescheduled.  Medication instructions: Do not take erectile dysfunction medications for 72 hours prior to test (sildenafil, tadalafil)  You may take your medications with water.  NO perfume, cologne or lotion on chest or abdomen area.   Total time is 1 to 2 hours; you may want to bring reading material for the waiting time.  In preparation for your appointment, medication and supplies will be purchased.  Appointment availability is limited, so if you need to cancel or reschedule, please call the Radiology Department Scheduler at [phone] 24 hours in advance to avoid a cancellation fee of $100.00  What to Expect When you Arrive:  Once you arrive and check in for your appointment, you will be taken to a preparation room within the Radiology Department.  A technologist or Nurse will obtain your medical history, verify that you are correctly prepped for the exam, and explain the procedure.  Afterwards, an IV will be started in your arm and electrodes will be placed on your skin for EKG monitoring during the stress portion of the  exam. Then you will be escorted to the PET/CT scanner.  There, staff will get you positioned on the scanner and obtain a blood pressure and EKG.  During the exam, you will continue to be connected to the EKG and blood pressure machines.  A small, safe amount of a  radioactive tracer will be injected in your IV to obtain a series of pictures of your heart along with an injection of a stress agent.    After your Exam:  It is recommended that you eat a meal and drink a caffeinated beverage to counter act any effects of the stress agent.  Drink plenty of fluids for the remainder of the day and urinate frequently for the first couple of hours after the exam.  Your doctor will inform you of your test results within 7-10 business days.  For more information and frequently asked questions, please visit our website: [email or web address]  For questions about your test or how to prepare for your test, please call: Cardiac Imaging Nurse Navigators Office: [phone]

## 2024-06-11 NOTE — Progress Notes (Signed)
 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  He has been trying to improve his lifestyle over the past several months after being found to have hypertensive urgency in June as well as lower extremity swelling.  He was found to have severe OSA and is starting on CPAP therapy today.  Since that time he has tried to increase his activity level but has not been able to tolerate exercise as he once used to.  He attributes this to deconditioning.  He does not get chest pain but does get short of breath with going up 1 flight of stairs.  His father had a stroke in his 58s.  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  Denies tobacco use.  Trying to increase his vegetable and grilled food intake but does eat fried foods.   Pertinent family history: Father stroke at 58, hypertension mother    ROS:  Review of Systems  All other systems reviewed and are negative.   Physical Exam  Physical Exam Vitals and nursing note reviewed.  Constitutional:      Appearance: Normal appearance. He is obese.  HENT:     Head: Normocephalic and atraumatic.  Eyes:     Conjunctiva/sclera: Conjunctivae normal.  Cardiovascular:     Rate and Rhythm: Normal rate and regular rhythm.     Comments: Distant heart sounds Pulmonary:     Effort: Pulmonary effort is normal.     Breath sounds: Normal breath sounds.  Musculoskeletal:        General: No swelling or tenderness.  Skin:    Coloration: Skin is not jaundiced or pale.  Neurological:     Mental Status: He is  alert.     VS:  BP 109/67 (BP Location: Right Arm, Patient Position: Sitting, Cuff Size: Large)   Pulse (!) 57   Ht 6' (1.829 m)   Wt (!) 479 lb (217.3 kg)   SpO2 93%   BMI 64.96 kg/m         Wt Readings from Last 3 Encounters:  06/11/24 (!) 479 lb (217.3 kg)  03/23/24 (!) 480 lb (217.7 kg)  02/28/24 (!) 488 lb (221.4 kg)     EKG Interpretation Date/Time:  Thursday June 11 2024 08:10:42 EDT Ventricular Rate:  57 PR Interval:  194 QRS Duration:  96 QT Interval:  412 QTC Calculation: 401 R Axis:   -11  Text Interpretation: Sinus bradycardia Nonspecific T wave abnormality When compared with ECG of 28-Feb-2024 21:58, No significant change since Confirmed by Calender Emeline [phone]) on 06/11/2024 8:19:26 AM    Studies Reviewed      Lipid panel 09/20/2023: Total cholesterol 188, triglycerides 133, HDL 43, LDL 118  Risk Assessment/Calculations             ASSESSMENT  Dyspnea on exertion possibly deconditioning however does have cardiovascular risk factors and a chest x-Dike in June showed cardiomegaly with vascular congestion so may have an underlying cardiac component Hypertension well-controlled on amlodipine-benazepril 10-40 mg, hydrochlorothiazide 12.5 mg, losartan  50 mg, Nebivolol  20 mg Hyperlipidemia PCP managing Severe OSA giving his CPAP machine this afternoon to start therapy CKD 3 Morbid obesity Type 2 diabetes   Plan  Will obtain a pharmacologic PET stress test for ischemic evaluation.  Not pursuing cardiac CTA due to renal dysfunction Echocardiogram Cardiac risk counseling and prevention recommendations: Heart healthy/Mediterranean diet with whole grains, fruits, vegetable, fish, lean meats, nuts, and olive oil. Limit salt. Moderate walking, 3-5 times/week for 30-50 minutes each session. Aim for at least 150 minutes.week. Goal should be pace of 3 miles/hour, or walking 1.5 miles in 30 minutes Avoidance of tobacco products. Avoid excess  alcohol.  Follow up: 6 months pending above workup     Informed Consent   Shared Decision Making/Informed Consent The risks [chest pain, shortness of breath, cardiac arrhythmias, dizziness, blood pressure fluctuations, myocardial infarction, stroke/transient ischemic attack, nausea, vomiting, allergic reaction, radiation exposure, metallic taste sensation and life-threatening complications (estimated to be 1 in 10,000)], benefits (risk stratification, diagnosing coronary artery disease, treatment guidance) and alternatives of a cardiac PET stress test were discussed in detail with Mr. Spatafore and he agrees to proceed.       Signed, Emeline FORBES Calender, MD

## 2024-07-03 ENCOUNTER — Encounter (HOSPITAL_COMMUNITY): Payer: Self-pay

## 2024-07-06 ENCOUNTER — Telehealth (HOSPITAL_COMMUNITY): Payer: Self-pay | Admitting: *Deleted

## 2024-07-06 NOTE — Telephone Encounter (Signed)
 Reaching out to patient to offer assistance regarding upcoming cardiac imaging study; pt verbalizes understanding of appt date/time, parking situation and where to check in, pre-test NPO status and verified current allergies; name and call back number provided for further questions should they arise  Chantal Requena RN Navigator Cardiac Imaging [facility] [phone] office [phone] cell  Patient aware to avoid caffeine 12 hours prior to his cardiac PET study.

## 2024-07-07 ENCOUNTER — Encounter (HOSPITAL_COMMUNITY)
Admission: RE | Admit: 2024-07-07 | Discharge: 2024-07-07 | Disposition: A | Discharge status: Home / Self Care | Payer: PRIVATE HEALTH INSURANCE | Source: Ambulatory Visit | Attending: Internal Medicine | Admitting: Internal Medicine

## 2024-07-07 DIAGNOSIS — R0609 Other forms of dyspnea: Secondary | ICD-10-CM | POA: Diagnosis present

## 2024-07-07 LAB — NM PET CT CARDIAC PERFUSION MULTI W/ABSOLUTE BLOODFLOW
LV dias vol: 220 mL (ref 62–150)
LV sys vol: 137 mL (ref 4.2–5.8)
MBFR: 1.51
Nuc Rest EF: 38 %
Nuc Stress EF: 49 %
Rest MBF: 0.71 ml/g/min
Rest Nuclear Isotope Dose: 30 mCi
ST Depression (mm): 0 mm
Stress MBF: 1.07 ml/g/min
Stress Nuclear Isotope Dose: 30 mCi
TID: 0.83

## 2024-07-07 MED ORDER — REGADENOSON 0.4 MG/5ML IV SOLN
0.4000 mg | Freq: Once | INTRAVENOUS | Status: AC
  Administered 2024-07-07: 0.4 mg via INTRAVENOUS

## 2024-07-07 MED ORDER — RUBIDIUM RB82 GENERATOR (RUBYFILL)
29.9000 | PACK | Freq: Once | INTRAVENOUS | Status: AC
  Administered 2024-07-07: 29.9 via INTRAVENOUS

## 2024-07-07 MED ORDER — CAFFEINE CITRATE BASE COMPONENT 10 MG/ML IV SOLN
INTRAVENOUS | Status: AC
Start: 2024-07-07 — End: 2024-07-07
  Filled 2024-07-07: qty 3

## 2024-07-07 MED ORDER — REGADENOSON 0.4 MG/5ML IV SOLN
INTRAVENOUS | Status: AC
  Filled 2024-07-07: qty 5

## 2024-07-07 NOTE — Progress Notes (Signed)
 Pt. Tolerated lexi scan well.

## 2024-07-10 ENCOUNTER — Ambulatory Visit: Payer: Self-pay | Admitting: Internal Medicine

## 2024-07-10 DIAGNOSIS — R7989 Other specified abnormal findings of blood chemistry: Secondary | ICD-10-CM

## 2024-07-10 MED ORDER — ASPIRIN 81 MG PO TBEC: 81.0000 mg | DELAYED_RELEASE_TABLET | Freq: Every day | ORAL | 1 refills | Status: AC

## 2024-07-10 NOTE — Telephone Encounter (Signed)
 I called the patient to discuss the results of his recent PET scan.  Unfortunately, the overall image quality was poor due to patient's body habitus.  That being said there was evidence of ischemia in the LAD territory with MBF: 1.51 but calcifications were not noted and so the question is whether or not this was breast tissue attenuation artifact versus true ischemia.  Unfortunately I am unable to review the images myself.  He was asymptomatic during the test. This study is intermediate risk.    Given the poor image quality with abnormal findings, I will start the patient on aspirin 81 mg daily and order a BMP.  If patient's creatinine has improved compared to his prior studies, I will order a coronary CTA to evaluate for calcifications and coronary anatomy.  He has sinus bradycardia and will not need beta-blockers prior to the study.  Thank you, Emeline Calender, DO

## 2024-07-13 NOTE — Telephone Encounter (Signed)
 Pt called in asking to speak with Dr. Kriste again. He has a couple more questions

## 2024-07-15 NOTE — Telephone Encounter (Signed)
-----   Message from Nurse Roxie PARAS sent at 07/14/2024  4:08 PM EST -----  ----- Message ----- From: Lenora Sheffield SAUNDERS Sent: 07/13/2024   3:47 PM EST To: [facility] Triage  ----- Message from Sheffield SAUNDERS Lenora sent at 07/13/2024  3:47 PM EST -----

## 2024-07-15 NOTE — Telephone Encounter (Signed)
 I called the patient and spoke to him and his mother on the phone who had some questions.  Mother was concerned about his low ejection fraction noted on the PET/CT.  I informed her that we do have an echocardiogram which is ordered and scheduled for 11/11 and that will be more helpful than the current PET scan for ejection fraction evaluation.  Any and all questions and concerns were answered.  Thank you, Emeline Calender, DO

## 2024-07-21 ENCOUNTER — Ambulatory Visit (HOSPITAL_COMMUNITY)
Admission: RE | Admit: 2024-07-21 | Payer: PRIVATE HEALTH INSURANCE | Source: Ambulatory Visit | Attending: Internal Medicine | Admitting: Internal Medicine

## 2024-07-21 ENCOUNTER — Telehealth: Admitting: Neurology

## 2024-07-21 ENCOUNTER — Telehealth (HOSPITAL_COMMUNITY): Payer: Self-pay | Admitting: Internal Medicine

## 2024-07-21 NOTE — Telephone Encounter (Signed)
 Patient NO SHOWED echocardiogram on 07/21/24. 1NO We will not call pt to reshedule due to HIGH NO SHOW RATE OF 26%. Order will be removed from the echo WQ. If patient calls back to reschedule we will reinstate the order. Thank you.

## 2024-07-21 NOTE — Telephone Encounter (Signed)
 fyi

## 2024-08-03 ENCOUNTER — Encounter: Payer: Self-pay | Admitting: Adult Health

## 2024-08-03 ENCOUNTER — Encounter: Admitting: Adult Health

## 2024-09-14 ENCOUNTER — Telehealth: Payer: Self-pay | Admitting: Neurology

## 2024-09-14 DIAGNOSIS — G4733 Obstructive sleep apnea (adult) (pediatric): Secondary | ICD-10-CM

## 2024-09-14 NOTE — Telephone Encounter (Signed)
 Spoke to DME will resend order this am .

## 2024-09-14 NOTE — Telephone Encounter (Signed)
 Pt called stating hat  He was getting his Cpap machine from different  DME , However insurance  is not covering Machine . Pt called [facility] and [facility] is requesting a new order for Pt Cpap be sent over to them for 30 days so Pt insurance can approve it .  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 Pt advised to LVM  and he will get back to you

## 2024-09-25 ENCOUNTER — Other Ambulatory Visit: Payer: Self-pay

## 2024-09-25 ENCOUNTER — Ambulatory Visit: Admitting: Sports Medicine

## 2024-09-25 ENCOUNTER — Encounter: Payer: Self-pay | Admitting: Sports Medicine

## 2024-09-25 ENCOUNTER — Other Ambulatory Visit (INDEPENDENT_AMBULATORY_CARE_PROVIDER_SITE_OTHER): Payer: Self-pay

## 2024-09-25 DIAGNOSIS — M7661 Achilles tendinitis, right leg: Secondary | ICD-10-CM

## 2024-09-25 DIAGNOSIS — M9261 Juvenile osteochondrosis of tarsus, right ankle: Secondary | ICD-10-CM | POA: Diagnosis not present

## 2024-09-25 DIAGNOSIS — G8929 Other chronic pain: Secondary | ICD-10-CM | POA: Diagnosis not present

## 2024-09-25 DIAGNOSIS — M79671 Pain in right foot: Secondary | ICD-10-CM

## 2024-09-25 DIAGNOSIS — M25571 Pain in right ankle and joints of right foot: Secondary | ICD-10-CM

## 2024-09-25 NOTE — Progress Notes (Signed)
 "  Howard Thomas - 44 y.o. male MRN [id number]  Date of birth: July 10, 1981  Office Visit Note: Visit Date: 09/25/2024 PCP: Larnell Hamilton, MD Referred by: Larnell Hamilton, MD  Subjective: Chief Complaint  Patient presents with   Right Ankle - Pain   HPI: Howard Thomas is a pleasant 44 y.o. male who presents today for acute on chronic right achilles/ankle pain.  Discussed the use of AI scribe software for clinical note transcription with the patient, who gave verbal consent to proceed.  History of Present Illness Howard Thomas is a 44 year old male with chronic right Haglund deformity, Achilles tendinopathy, and retrocalcaneal bursitis who presents with persistent right posterior heel pain.  Pain is localized to the posterior right heel at the Achilles insertion without proximal radiation. He notes a persistent tender nodule in this area, aggravated by pressure from footwear and with walking, turning, pushing off, descending stairs (especially when the right leg is last on the step), and sitting with feet hanging off a stool. 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 Pain ranges from mild discomfort on good days to more pronounced pain on bad days and can feel hurt, feel good with massage or deep tissue manipulation.  Stretching sometimes helps when the area feels inflamed but can worsen pain at other times. He uses Tylenol  intermittently but has not tried NSAIDs, topical agents, or prescription medications. There has been no acute injury, audible pop, swelling, ankle instability, or pain above the heel or into the calf.  Prior x-rays reportedly showed bony spurs and a prominent posterior heel bump. He has not had advanced imaging or prior procedures for this condition.    Pertinent ROS were reviewed with the patient and found to be negative unless otherwise specified above in HPI.   Assessment & Plan: Visit Diagnoses:  1. Chronic heel pain, right   2. Haglund  deformity of right heel   3. 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 Imaging showed no tendon tear or significant thickening, but a degree of tendinosis and insertional calcifications with enthesiophytes. Symptoms attributed to mechanical irritation and inflammation. Conservative management appropriate. - Reviewed imaging showing bony prominence, calcifications, and bursitis. - Issued bilateral heel lifts 1/2 to reduce Achilles tension and offload; advised use in both shoes. - Recommended ice and oral NSAIDs for symptomatic relief during flares. - Recommended topical diclofenac gel up to three times daily as needed. - Referred to physical therapy in [place] for few sessions for stretching and strengthening program --> HEP - Discussed shockwave therapy for persistent symptoms or calcification; deferred for now, but would be next step - Provided guidance on benign nature of findings and low rupture risk; advised continued exercise as tolerated. - Advised follow-up in six weeks if not improved or sooner if symptoms worsen.  Chronic right heel pain Chronic right heel pain secondary to Haglund deformity, Achilles tendinopathy, and retrocalcaneal bursitis. Pain is intermittent and activity-related. No acute injury or tendon tear. - Addressed chronic pain through conservative management including mechanical offloading, anti-inflammatory strategies, and physical therapy. - Reassured absence of acute injury or tendon tear; encouraged activity within tolerance.  *Additional considerations: Shockwave, scheduled NSAID/pred  Follow-up: Return in about 6 weeks (around 11/06/2024), or if symptoms worsen or fail to improve.   Meds & Orders: No orders of the  defined types were  placed in this encounter.   Orders Placed This Encounter  Procedures   XR Ankle Complete Right   US  Extrem Low Right Ltd   Ambulatory referral to Physical Therapy     Procedures: No procedures performed      Clinical History: No specialty comments available.  He reports that he has never smoked. He has never used smokeless tobacco. No results for input(s): HGBA1C, LABURIC in the last 8760 hours.  Objective:    Physical Exam  Gen: Well-appearing, in no acute distress; non-toxic CV: Well-perfused. Warm.  Resp: Breathing unlabored on room air; no wheezing. Psych: Fluid speech in conversation; appropriate affect; normal thought process  *MSK/Ortho Exam: Physical Exam MUSCULOSKELETAL: Foot non-tender to palpation. No tearing in the Achilles tendon. Small amount of inflammation in the Achilles tendon.  Right foot/ankle: Small to moderate Haglund deformity present, tenderness at the distal insertion of the Achilles with soft tissue swelling in the retrocalcaneal bursa.  Ankle dorsiflexion slightly limited about 90 degrees compared to 95 degrees of the contralateral side.  Intact Thompson squeeze test.  Imaging: US  Extrem Low Right Ltd Result Date: 09/25/2024 Limited musculoskeletal ultrasound of the right Achilles and calcaneus was performed today.  Ultrasound demonstrates intact Achilles tendon.  There is a degree of distal Achilles tendinopathy with insertional calcification.  Mild hyperemia in this location.  There is a small reactive retrocalcaneal bursitis.  There is superior calcaneal spurring noted with cortical regularity.  Scanning the mid to proximal Achilles, there is no significant thickening or evidence of tendon tearing.   Distal Achilles calcific tendinopathy with calcaneal spurring and mild retrocalcaneal bursitis.  XR Ankle Complete Right Result Date: 09/25/2024 Three-view x-Gilkerson of the right ankle including AP, mortise, lateral film were ordered and reviewed by  myself today.  X-rays demonstrate cortical regularity at the superior calcaneus at the insertion of the Achilles tendon with enthesophytes and spurring.  There is also a Haglund deformity present.  Mild to moderate midfoot arthritic change present as well. No acute fracture noted.   Past Medical/Family/Surgical/Social History: Medications & Allergies reviewed per EMR, new medications updated. Patient Active Problem List   Diagnosis Date Noted   Chest pain 11/27/2016   Morbid obesity (HCC) 11/27/2016   OSA (obstructive sleep apnea) 11/27/2016   Gastroesophageal reflux disease 11/27/2016   Mixed hyperlipidemia 11/27/2016   Past Medical History:  Diagnosis Date   Diabetes (HCC)    12/06/14  pt denies   Dyslipidemia    GERD (gastroesophageal reflux disease)    Gout    H/O seasonal allergies    Hypertension    Joint pain    Morbid obesity (HCC)    Paresthesia    Pilonidal cyst    Family History  Problem Relation Age of Onset   Hypertension Mother    CVA Father    Hyperlipidemia Maternal Grandmother    Sleep apnea Neg Hx    Past Surgical History:  Procedure Laterality Date   PILONIDAL CYST EXCISION N/A 12/10/2014   Procedure: CYST EXCISION PILONIDAL EXTENSIVE;  Surgeon: Donnice Lunger, MD;  Location: [facility];  Service: General;  Laterality: N/A;   THROAT SURGERY  2012   exc vocal cord lesion   Social History   Occupational History   Not on file  Tobacco Use   Smoking status: Never   Smokeless tobacco: Never  Vaping Use   Vaping status: Not on file  Substance and Sexual Activity   Alcohol use: No    Alcohol/week: 0.0  standard drinks of alcohol   Drug use: No   Sexual activity: Not on file   "

## 2024-09-25 NOTE — Progress Notes (Signed)
 Patient says that he initially tweaked his achilles about 2 years ago in the gym. He says that at that time, it felt like a pulled muscle. 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 His pain has been more constant since that time. He points to the achilles when describing his pain. He says that he has tried stretching it, although that does seem to aggravate it more. He feels it most when his ankle is in a dorsiflexed position. He has not otherwise done anything to treat his pain. He takes Tylenol  only as needed.

## 2024-10-08 ENCOUNTER — Ambulatory Visit

## 2024-10-08 NOTE — Therapy (Incomplete)
 " OUTPATIENT PHYSICAL THERAPY LOWER EXTREMITY EVALUATION   Patient Name: Howard Thomas MRN: [id number] DOB:1981-05-11, 44 y.o., male Today's Date: 10/08/2024  END OF SESSION:   Past Medical History:  Diagnosis Date   Diabetes (HCC)    12/06/14  pt denies   Dyslipidemia    GERD (gastroesophageal reflux disease)    Gout    H/O seasonal allergies    Hypertension    Joint pain    Morbid obesity (HCC)    Paresthesia    Pilonidal cyst    Past Surgical History:  Procedure Laterality Date   PILONIDAL CYST EXCISION N/A 12/10/2014   Procedure: CYST EXCISION PILONIDAL EXTENSIVE;  Surgeon: Donnice Lunger, MD;  Location: [facility];  Service: General;  Laterality: N/A;   THROAT SURGERY  2012   exc vocal cord lesion   Patient Active Problem List   Diagnosis Date Noted   Chest pain 11/27/2016   Morbid obesity (HCC) 11/27/2016   OSA (obstructive sleep apnea) 11/27/2016   Gastroesophageal reflux disease 11/27/2016   Mixed hyperlipidemia 11/27/2016    PCP: Larnell Hamilton, MD   REFERRING PROVIDER: Burnetta Brunet, DO   REFERRING DIAG:  [phone] (ICD-10-CM) - Chronic heel pain, right  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  Rationale for Evaluation and Treatment: Rehabilitation  ONSET DATE: ***  SUBJECTIVE:   SUBJECTIVE STATEMENT: ***  PERTINENT HISTORY: *** PAIN:  Are you having pain? Yes: NPRS scale: *** Pain location: *** Pain description: *** Aggravating factors: *** Relieving factors: ***  PRECAUTIONS: {Therapy precautions:24002}  RED FLAGS: {PT Red Flags:29287}   WEIGHT BEARING RESTRICTIONS: {Yes ***/No:24003}  FALLS:  Has patient fallen in last 6 months? {fallsyesno:27318}  LIVING ENVIRONMENT: Lives with: {[facility] lives with:25569::lives with their family} Lives in: {Lives in:25570} Stairs: {opstairs:27293} Has following equipment at home:  {Assistive devices:23999}  OCCUPATION: ***  PLOF: {PLOF:24004}  PATIENT GOALS: ***  NEXT MD VISIT: ***  OBJECTIVE:  Note: Objective measures were completed at Evaluation unless otherwise noted.  DIAGNOSTIC FINDINGS: ***  PATIENT SURVEYS:  PSFS: THE PATIENT SPECIFIC FUNCTIONAL SCALE  Place score of 0-10 (0 = unable to perform activity and 10 = able to perform activity at the same level as before injury or problem)  Activity Date: 10/08/24         2.     3.     4.      Total Score ***      Total Score = Sum of activity scores/number of activities  Minimally Detectable Change: 3 points (for single activity); 2 points (for average score)  [facility] (nd). The Patient Specific Functional Scale . Retrieved from [email or web address]   COGNITION: Overall cognitive status: Within functional limits for tasks assessed     SENSATION: {sensation:27233}  EDEMA:  {edema:24020}  MUSCLE LENGTH: Hamstrings: Right *** deg; Left *** deg Debby test: Right *** deg; Left *** deg  POSTURE: {posture:25561}  PALPATION: ***  LOWER EXTREMITY ROM:  Active/PassiveROM Right eval Left eval  Hip flexion    Hip extension    Hip abduction    Hip adduction    Hip internal rotation    Hip external rotation    Knee flexion    Knee extension    Ankle dorsiflexion ***   Ankle plantarflexion    Ankle inversion    Ankle eversion     (Blank rows = not tested)  LOWER EXTREMITY  MMT:  MMT Right eval Left eval  Hip flexion    Hip extension    Hip abduction    Hip adduction    Hip internal rotation    Hip external rotation    Knee flexion    Knee extension    Ankle dorsiflexion ***   Ankle plantarflexion    Ankle inversion    Ankle eversion     (Blank rows = not tested)  LOWER EXTREMITY SPECIAL TESTS:  Ankle special tests: Thompson's test: {pos/neg:25230}  FUNCTIONAL TESTS:  5 times sit to stand:  ***  GAIT: Distance walked: *** Assistive device utilized: {Assistive devices:23999} Level of assistance: {Levels of assistance:24026} Comments: ***                                                                                                                                TREATMENT DATE: ***    PATIENT EDUCATION:  Education details: HEP Person educated: Patient Education method: Programmer, Multimedia, Demonstration, Actor cues, Verbal cues, and Handouts Education comprehension: verbalized understanding, returned demonstration, and verbal cues required  HOME EXERCISE PROGRAM: ***  ASSESSMENT:  CLINICAL IMPRESSION: Patient is a 44 y.o. male who was seen today for physical therapy evaluation and treatment for R heel pain and Achilles tendonitis. He presents with *** Pt would benefit from skilled PT services to address the above deficits and return to previous LOA.  OBJECTIVE IMPAIRMENTS: {opptimpairments:25111}.   ACTIVITY LIMITATIONS: {activitylimitations:27494}  PARTICIPATION LIMITATIONS: {participationrestrictions:25113}  PERSONAL FACTORS: {Personal factors:25162} are also affecting patient's functional outcome.   REHAB POTENTIAL: {rehabpotential:25112}  CLINICAL DECISION MAKING: {clinical decision making:25114}  EVALUATION COMPLEXITY: {Evaluation complexity:25115}   GOALS: Goals reviewed with patient? Yes  SHORT TERM GOALS: Target date: *** *** Baseline: Goal status: INITIAL  2.  *** Baseline:  Goal status: INITIAL  3.  *** Baseline:  Goal status: INITIAL  4.  *** Baseline:  Goal status: INITIAL  5.  *** Baseline:  Goal status: INITIAL  6.  *** Baseline:  Goal status: INITIAL  LONG TERM GOALS: Target date: ***  *** Baseline:  Goal status: INITIAL  2.  *** Baseline:  Goal status: INITIAL  3.  *** Baseline:  Goal status: INITIAL  4.  *** Baseline:  Goal status: INITIAL  5.  *** Baseline:  Goal status: INITIAL  6.  *** Baseline:   Goal status: INITIAL   PLAN:  PT FREQUENCY: {rehab frequency:25116}  PT DURATION: {rehab duration:25117}  PLANNED INTERVENTIONS: {rehab planned interventions:25118::97110-Therapeutic exercises,97530- Therapeutic [phone]- Neuromuscular re-education,97535- Self Rjmz,02859- Manual therapy,Patient/Family education}  PLAN FOR NEXT SESSION: ***   Burnard CHRISTELLA Meth, PT 10/08/2024, 7:37 AM  "

## 2024-10-09 ENCOUNTER — Other Ambulatory Visit: Payer: Self-pay

## 2024-10-09 ENCOUNTER — Other Ambulatory Visit (HOSPITAL_COMMUNITY): Payer: Self-pay

## 2024-10-09 MED ORDER — ZEPBOUND 5 MG/0.5ML ~~LOC~~ SOAJ
0.5000 mL | SUBCUTANEOUS | 1 refills | Status: AC
  Filled 2024-10-09 (×3): qty 2, 28d supply, fill #0

## 2024-10-27 ENCOUNTER — Ambulatory Visit: Admitting: Physical Therapy

## 2024-11-05 ENCOUNTER — Ambulatory Visit: Admitting: Sports Medicine
